# Patient Record
Sex: Female | Born: 1935 | Race: White | Hispanic: No | Smoking: Never smoker
Health system: Southern US, Community
[De-identification: ages and names within clinical notes are randomized; demographics above are authoritative.]

## PROBLEM LIST (undated history)

## (undated) DIAGNOSIS — M6281 Muscle weakness (generalized): Secondary | ICD-10-CM

## (undated) DIAGNOSIS — E079 Disorder of thyroid, unspecified: Secondary | ICD-10-CM

## (undated) DIAGNOSIS — S37009A Unspecified injury of unspecified kidney, initial encounter: Secondary | ICD-10-CM

## (undated) DIAGNOSIS — J849 Interstitial pulmonary disease, unspecified: Secondary | ICD-10-CM

## (undated) DIAGNOSIS — E039 Hypothyroidism, unspecified: Secondary | ICD-10-CM

## (undated) DIAGNOSIS — I10 Essential (primary) hypertension: Secondary | ICD-10-CM

## (undated) DIAGNOSIS — D649 Anemia, unspecified: Secondary | ICD-10-CM

## (undated) DIAGNOSIS — I509 Heart failure, unspecified: Secondary | ICD-10-CM

## (undated) DIAGNOSIS — F0390 Unspecified dementia without behavioral disturbance: Secondary | ICD-10-CM

## (undated) DIAGNOSIS — E785 Hyperlipidemia, unspecified: Secondary | ICD-10-CM

## (undated) HISTORY — PX: CATARACT EXTRACTION: SUR2

## (undated) HISTORY — PX: TONSILLECTOMY: SUR1361

## (undated) HISTORY — PX: TOTAL HIP ARTHROPLASTY: SHX124

---

## 2006-12-14 ENCOUNTER — Observation Stay (HOSPITAL_COMMUNITY): Admission: AD | Admit: 2006-12-14 | Discharge: 2006-12-15 | Payer: Self-pay | Admitting: Internal Medicine

## 2010-04-11 ENCOUNTER — Inpatient Hospital Stay (HOSPITAL_COMMUNITY)
Admission: EM | Admit: 2010-04-11 | Discharge: 2010-04-17 | DRG: 481 | Disposition: A | Discharge status: Skilled Nursing Facility | Payer: Medicare Other | Attending: Internal Medicine | Admitting: Internal Medicine

## 2010-04-11 ENCOUNTER — Emergency Department (HOSPITAL_COMMUNITY): Payer: Medicare Other

## 2010-04-11 DIAGNOSIS — I2789 Other specified pulmonary heart diseases: Secondary | ICD-10-CM | POA: Diagnosis present

## 2010-04-11 DIAGNOSIS — E785 Hyperlipidemia, unspecified: Secondary | ICD-10-CM | POA: Diagnosis present

## 2010-04-11 DIAGNOSIS — J40 Bronchitis, not specified as acute or chronic: Secondary | ICD-10-CM | POA: Diagnosis present

## 2010-04-11 DIAGNOSIS — J8410 Pulmonary fibrosis, unspecified: Secondary | ICD-10-CM | POA: Diagnosis present

## 2010-04-11 DIAGNOSIS — D638 Anemia in other chronic diseases classified elsewhere: Secondary | ICD-10-CM | POA: Diagnosis present

## 2010-04-11 DIAGNOSIS — E119 Type 2 diabetes mellitus without complications: Secondary | ICD-10-CM | POA: Diagnosis present

## 2010-04-11 DIAGNOSIS — W19XXXA Unspecified fall, initial encounter: Secondary | ICD-10-CM | POA: Diagnosis present

## 2010-04-11 DIAGNOSIS — J4489 Other specified chronic obstructive pulmonary disease: Secondary | ICD-10-CM | POA: Diagnosis present

## 2010-04-11 DIAGNOSIS — J811 Chronic pulmonary edema: Secondary | ICD-10-CM | POA: Diagnosis present

## 2010-04-11 DIAGNOSIS — Y92009 Unspecified place in unspecified non-institutional (private) residence as the place of occurrence of the external cause: Secondary | ICD-10-CM

## 2010-04-11 DIAGNOSIS — J449 Chronic obstructive pulmonary disease, unspecified: Secondary | ICD-10-CM | POA: Diagnosis present

## 2010-04-11 DIAGNOSIS — I5030 Unspecified diastolic (congestive) heart failure: Secondary | ICD-10-CM | POA: Diagnosis present

## 2010-04-11 DIAGNOSIS — E039 Hypothyroidism, unspecified: Secondary | ICD-10-CM | POA: Diagnosis present

## 2010-04-11 DIAGNOSIS — S72033A Displaced midcervical fracture of unspecified femur, initial encounter for closed fracture: Principal | ICD-10-CM | POA: Diagnosis present

## 2010-04-11 DIAGNOSIS — N289 Disorder of kidney and ureter, unspecified: Secondary | ICD-10-CM | POA: Diagnosis present

## 2010-04-11 LAB — CBC
HCT: 34.6 % — ABNORMAL LOW (ref 36.0–46.0)
Hemoglobin: 10.8 g/dL — ABNORMAL LOW (ref 12.0–15.0)
MCH: 31.4 pg (ref 26.0–34.0)
Platelets: 192 10*3/uL (ref 150–400)
RBC: 3.44 MIL/uL — ABNORMAL LOW (ref 3.87–5.11)
RDW: 13.2 % (ref 11.5–15.5)
WBC: 10.9 10*3/uL — ABNORMAL HIGH (ref 4.0–10.5)

## 2010-04-11 LAB — BASIC METABOLIC PANEL
CO2: 26 mEq/L (ref 19–32)
Calcium: 8.9 mg/dL (ref 8.4–10.5)
Chloride: 105 mEq/L (ref 96–112)
Creatinine, Ser: 1.42 mg/dL — ABNORMAL HIGH (ref 0.4–1.2)
Potassium: 4.9 mEq/L (ref 3.5–5.1)
Sodium: 138 mEq/L (ref 135–145)

## 2010-04-11 LAB — GLUCOSE, CAPILLARY
Glucose-Capillary: 152 mg/dL — ABNORMAL HIGH (ref 70–99)
Glucose-Capillary: 162 mg/dL — ABNORMAL HIGH (ref 70–99)

## 2010-04-11 LAB — DIFFERENTIAL
Basophils Absolute: 0 10*3/uL (ref 0.0–0.1)
Eosinophils Absolute: 0.2 10*3/uL (ref 0.0–0.7)
Neutro Abs: 8.2 10*3/uL — ABNORMAL HIGH (ref 1.7–7.7)

## 2010-04-11 LAB — URINALYSIS, ROUTINE W REFLEX MICROSCOPIC
Bilirubin Urine: NEGATIVE
Glucose, UA: NEGATIVE mg/dL
Nitrite: NEGATIVE
Urobilinogen, UA: 0.2 mg/dL (ref 0.0–1.0)
pH: 5.5 (ref 5.0–8.0)

## 2010-04-12 ENCOUNTER — Inpatient Hospital Stay (HOSPITAL_COMMUNITY): Payer: Medicare Other

## 2010-04-12 LAB — GLUCOSE, CAPILLARY: Glucose-Capillary: 145 mg/dL — ABNORMAL HIGH (ref 70–99)

## 2010-04-12 LAB — BASIC METABOLIC PANEL
CO2: 25 mEq/L (ref 19–32)
Calcium: 8.5 mg/dL (ref 8.4–10.5)
GFR calc Af Amer: 52 mL/min — ABNORMAL LOW (ref >60)
GFR calc non Af Amer: 43 mL/min — ABNORMAL LOW (ref >60)
Sodium: 139 mEq/L (ref 135–145)

## 2010-04-12 LAB — URINE CULTURE: Colony Count: NO GROWTH

## 2010-04-12 LAB — PROTIME-INR
INR: 1.03 (ref 0.00–1.49)
Prothrombin Time: 13.7 seconds (ref 11.6–15.2)

## 2010-04-12 LAB — CBC
Hemoglobin: 10.3 g/dL — ABNORMAL LOW (ref 12.0–15.0)
MCV: 100.6 fL — ABNORMAL HIGH (ref 78.0–100.0)
Platelets: 168 10*3/uL (ref 150–400)
RDW: 13.2 % (ref 11.5–15.5)
WBC: 11.2 10*3/uL — ABNORMAL HIGH (ref 4.0–10.5)

## 2010-04-13 LAB — BASIC METABOLIC PANEL
CO2: 23 mEq/L (ref 19–32)
Chloride: 107 mEq/L (ref 96–112)
Creatinine, Ser: 1.37 mg/dL — ABNORMAL HIGH (ref 0.4–1.2)
Glucose, Bld: 112 mg/dL — ABNORMAL HIGH (ref 70–99)
Potassium: 4.5 mEq/L (ref 3.5–5.1)
Sodium: 138 mEq/L (ref 135–145)

## 2010-04-13 LAB — CBC
HCT: 27.5 % — ABNORMAL LOW (ref 36.0–46.0)
MCH: 31.5 pg (ref 26.0–34.0)
Platelets: 150 10*3/uL (ref 150–400)
RBC: 2.76 MIL/uL — ABNORMAL LOW (ref 3.87–5.11)
RDW: 13.1 % (ref 11.5–15.5)
WBC: 10.3 10*3/uL (ref 4.0–10.5)

## 2010-04-13 LAB — PROTIME-INR: INR: 1.08 (ref 0.00–1.49)

## 2010-04-13 LAB — GLUCOSE, CAPILLARY: Glucose-Capillary: 95 mg/dL (ref 70–99)

## 2010-04-14 ENCOUNTER — Inpatient Hospital Stay (HOSPITAL_COMMUNITY): Payer: Medicare Other

## 2010-04-14 LAB — CBC
HCT: 28 % — ABNORMAL LOW (ref 36.0–46.0)
Hemoglobin: 9 g/dL — ABNORMAL LOW (ref 12.0–15.0)
MCV: 100.4 fL — ABNORMAL HIGH (ref 78.0–100.0)
RDW: 13.1 % (ref 11.5–15.5)
WBC: 8.9 10*3/uL (ref 4.0–10.5)

## 2010-04-14 LAB — GLUCOSE, CAPILLARY
Glucose-Capillary: 128 mg/dL — ABNORMAL HIGH (ref 70–99)
Glucose-Capillary: 148 mg/dL — ABNORMAL HIGH (ref 70–99)
Glucose-Capillary: 169 mg/dL — ABNORMAL HIGH (ref 70–99)
Glucose-Capillary: 120 mg/dL — ABNORMAL HIGH (ref 70–99)

## 2010-04-14 LAB — DIFFERENTIAL
Eosinophils Relative: 0 % (ref 0–5)
Lymphocytes Relative: 11 % — ABNORMAL LOW (ref 12–46)
Lymphs Abs: 1 10*3/uL (ref 0.7–4.0)
Monocytes Absolute: 1 10*3/uL (ref 0.1–1.0)
Neutro Abs: 6.9 10*3/uL (ref 1.7–7.7)

## 2010-04-14 LAB — PROTIME-INR: INR: 1.14 (ref 0.00–1.49)

## 2010-04-14 LAB — BASIC METABOLIC PANEL
CO2: 22 mEq/L (ref 19–32)
Chloride: 108 mEq/L (ref 96–112)
Creatinine, Ser: 1.38 mg/dL — ABNORMAL HIGH (ref 0.4–1.2)
GFR calc Af Amer: 43 mL/min — ABNORMAL LOW (ref >60)
Potassium: 4.1 mEq/L (ref 3.5–5.1)

## 2010-04-15 LAB — CBC
HCT: 28.6 % — ABNORMAL LOW (ref 36.0–46.0)
MCHC: 30.8 g/dL (ref 30.0–36.0)
MCV: 100.4 fL — ABNORMAL HIGH (ref 78.0–100.0)
RDW: 13.5 % (ref 11.5–15.5)
WBC: 8.7 10*3/uL (ref 4.0–10.5)

## 2010-04-15 LAB — URINE MICROSCOPIC-ADD ON

## 2010-04-15 LAB — GLUCOSE, CAPILLARY: Glucose-Capillary: 170 mg/dL — ABNORMAL HIGH (ref 70–99)

## 2010-04-15 LAB — URINALYSIS, ROUTINE W REFLEX MICROSCOPIC
Bilirubin Urine: NEGATIVE
Ketones, ur: NEGATIVE mg/dL
Nitrite: NEGATIVE
Protein, ur: 30 mg/dL — AB
Urobilinogen, UA: 0.2 mg/dL (ref 0.0–1.0)

## 2010-04-16 LAB — CBC
HCT: 29.2 % — ABNORMAL LOW (ref 36.0–46.0)
MCH: 31.6 pg (ref 26.0–34.0)
MCHC: 31.5 g/dL (ref 30.0–36.0)
RDW: 13.5 % (ref 11.5–15.5)

## 2010-04-16 LAB — GLUCOSE, CAPILLARY
Glucose-Capillary: 234 mg/dL — ABNORMAL HIGH (ref 70–99)
Glucose-Capillary: 139 mg/dL — ABNORMAL HIGH (ref 70–99)

## 2010-04-16 LAB — BASIC METABOLIC PANEL
BUN: 32 mg/dL — ABNORMAL HIGH (ref 6–23)
CO2: 24 mEq/L (ref 19–32)
Chloride: 108 mEq/L (ref 96–112)
Creatinine, Ser: 1.19 mg/dL (ref 0.4–1.2)
Glucose, Bld: 114 mg/dL — ABNORMAL HIGH (ref 70–99)

## 2010-04-16 LAB — URINE CULTURE
Culture  Setup Time: 201203140428
Special Requests: NEGATIVE

## 2010-04-16 LAB — BRAIN NATRIURETIC PEPTIDE: Pro B Natriuretic peptide (BNP): 222 pg/mL — ABNORMAL HIGH (ref 0.0–100.0)

## 2010-04-16 LAB — PROTIME-INR: INR: 1.93 — ABNORMAL HIGH (ref 0.00–1.49)

## 2010-04-17 LAB — BASIC METABOLIC PANEL
CO2: 26 mEq/L (ref 19–32)
Calcium: 8 mg/dL — ABNORMAL LOW (ref 8.4–10.5)
Creatinine, Ser: 1.06 mg/dL (ref 0.4–1.2)
GFR calc Af Amer: 58 mL/min — ABNORMAL LOW (ref >60)
GFR calc non Af Amer: 48 mL/min — ABNORMAL LOW (ref >60)
Sodium: 138 mEq/L (ref 135–145)

## 2010-04-17 LAB — PROTIME-INR
INR: 2.81 — ABNORMAL HIGH (ref 0.00–1.49)
Prothrombin Time: 29.7 seconds — ABNORMAL HIGH (ref 11.6–15.2)

## 2010-04-17 LAB — CBC
Hemoglobin: 8.7 g/dL — ABNORMAL LOW (ref 12.0–15.0)
MCHC: 31.3 g/dL (ref 30.0–36.0)
Platelets: 212 10*3/uL (ref 150–400)
RDW: 13.3 % (ref 11.5–15.5)

## 2010-04-17 LAB — GLUCOSE, CAPILLARY: Glucose-Capillary: 119 mg/dL — ABNORMAL HIGH (ref 70–99)

## 2010-06-16 NOTE — H&P (Signed)
NAMEWINDY, DUDEK NO.:  [id number]   MEDICAL RECORD NO.:  [id number]          PATIENT TYPE:  INP   LOCATION:  5731                         FACILITY:  [facility]   PHYSICIAN:  Lonia Blood, M.D.       DATE OF BIRTH:  13-Jan-1914   DATE OF ADMISSION:  12/14/2006  DATE OF DISCHARGE:                              HISTORY & PHYSICAL   PRIMARY CARE PHYSICIAN:  Gloriajean Dell. Andrey Campanile, M.D.   CHIEF COMPLAINT:  Weak, polyuria.   HISTORY OF PRESENT ILLNESS:  Ms. Bui is a 77 year old woman who was  taken to her primary care physician's office after she has been  complaining of increasing thirst and urination for the past couple  months.  The patient was also complaining of some dysuria, foul-smelling  urine, and low-grade temperatures.  When seen in the physician's office  the patient was found to have leukocytosis up to 18,000 and her CBG was  too high to measure.  The patient denies any fever, chills, nausea,  vomiting, or abdominal pain.   PAST MEDICAL HISTORY:  1. Hypertension.  2. Seasonal allergy.  3. Cataract surgeries.  4. Tonsillectomy.   HOME MEDICATIONS:  Hydrochlorothiazide, atenolol, and multivitamins.   ALLERGIES:  PENICILLIN.   SOCIAL HISTORY:  The patient lives alone.  She has a daughter who checks  on her twice a day.  The patient has never smoked cigarettes, and she  does not drink any alcohol.  She is retired, and she has also been a  widow for the past 15 years.  She is not driving anymore.   FAMILY HISTORY:  The patient has a sister who was diagnosed with  diabetes in her 19s and with Alzheimer as well in her 51s.   REVIEW OF SYSTEMS:  As per HPI.  All other systems have been reviewed,  and they are negative with the exception of some difficulties hearing,  and difficulties with vision.  The patient is wearing glasses.   PHYSICAL EXAM:  GENERAL:  Upon admission shows a well-developed, well-  nourished elderly woman in no acute distress.  She is  alert and oriented  to place, person, and time.  HEAD:  Appears normocephalic, atraumatic.  EYES:  Pupils equal, round, react to light and accommodation.  There is  bilateral iridodonesis  Extraocular movements are intact.  MOUTH:  The patient's mouth is showing wide spread caries, and missing  multiple teeth.  THROAT:  Clear.  NECK:  Supple.  No JVD.  No carotid bruits.  CHEST:  Scattered rhonchi and crackles and no wheezes.  HEART:  Regular without murmurs, rubs, or gallop.  ABDOMEN:  The patient's abdomen is soft, nontender, nondistended, bowel  sounds are present.  GENITOURINARY:  There is minor left CVA tenderness.  EXTREMITIES:  Lower Extremities have +1 bilateral edema.  SKIN:  Dry.  Turgor is positive.  There are no suspicious rashes.  NEUROLOGICAL EXAM:  Cranial nerves III-XII are intact.  Sensation is  intact in all 4 extremities.  Strength 5/5 in all four extremities.  FUNCTIONAL STATUS:  The patient is ambulating with a  walker for safety.  MUSCULOSKELETAL:  The patient has some moderate thoracic kyphosis and  deformities of the distal interphalangeal joints on both hands.   LABORATORY VALUES:  Everything is pending.   ASSESSMENT AND PLAN:  1. Newly diagnosed uncontrolled diabetes mellitus type 2 leading to      dehydration in an elderly community-dwelling woman.  Plan is to      admit the patient to the hospital, place her on intravenous fluids.      Check a hemoglobin A1c.  Educate the patient about the proper diet.      Initiate treatment with insulin for now to bring a rapid decline in      the blood glucose levels.  The patient will also be started on      Amaryl and maybe metformin depending on her renal function.  I do      suspect that with proper diet and medications Ms. Berte can      achieve an acceptable control of her diabetes.  2. Urinary tract infection.  The patient's urine will be cultured, and      she will be placed empirically on ciprofloxacin since  she is      PENICILLIN allergic.  3. Weak, deconditioned.  Ms. Wiegman will have a physical therapy      evaluation to assess safety of returning home.  4. Code status has been discussed with the patient and her daughter,      and the patient has elected a do not resuscitate status.      Lonia Blood, M.D.  Electronically Signed     SL/MEDQ  D:  12/14/2006  T:  12/15/2006  Job:  [id number]   cc:   Gloriajean Dell. Andrey Campanile, M.D.

## 2010-06-16 NOTE — Discharge Summary (Signed)
Christina Douglas, Christina Douglas NO.:  [id number]   MEDICAL RECORD NO.:  [id number]          PATIENT TYPE:  OBV   LOCATION:  5731                         FACILITY:  [facility]   PHYSICIAN:  Lonia Blood, M.D.       DATE OF BIRTH:  [birth year]   DATE OF ADMISSION:  12/14/2006  DATE OF DISCHARGE:  12/15/2006                               DISCHARGE SUMMARY   DISCHARGE DIAGNOSES:  1. New onset uncontrolled diabetes mellitus Type 2 with measured      hemoglobin A-1-C of 9.8.  2. Dehydration secondary to the above.  3. Leukocytosis secondary to the above.  4. Urinary tract infection.  5. New diagnosis of hypothyroidism with a measured TSH of 148.  6. Hyperlipidemia.  7. Anemia of chronic disease; defer workup for the outpatient setting.  8. Chronic interstitial lung disease of unknown significance.  9. Weakness and deconditioning.  10.Bradycardia secondary to atenolol and hypothyroidism.  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   DISCHARGE MEDICATIONS:  1. Levothyroxine 50 mcg by mouth daily.  2. Amlodipine 5 mg by mouth daily.  3. Glimepiride  2 mg daily.  4. Vitamins daily.  5. Ciprofloxacin 500 mg by mouth daily for 3 days.  6. Lantus 5 units at bedtime if the morning CBG is more than 300.   CONDITION ON DISCHARGE:  The patient was discharged home with Home  Health Services.  She was discharged under the care of her daughter.  The patient will follow up with Dr. Benedetto Goad on December 21, 2006.  The patient was also referred to Dr. Donaciano Eva office for an audiogram and  hearing aid.  At the time of discharge the patient was thoroughly  educated about her new diagnosis of diabetes.  The patient was referred  to outpatient classes.  The patient was given a prescription for  Glucometer as well as lancets and syringes.   PROCEDURES DURING THIS ADMISSION:  No procedures were done.   CONSULTATIONS:  No consultations were obtained.   HISTORY AND PHYSICAL EXAMINATION:  For the history and physical  examination please refer to the dictated H&P done by Dr. Lavera Guise December 14, 2006.   HOSPITAL COURSE:  Problem 1  Newly diagnosed uncontrolled diabetes  mellitus Type 2.  This patient presented from her primary care  physician's office with polyuria, polydipsia, dehydration, leukocytosis,  and extreme elevation of the CBGs.  The patient was educated about her  new diagnosis.  She was placed on intravenous fluids.  She received  repeat doses of NovoLog and Lantus.  The patient was setup with diabetes  education and she was discharged home on Amaryl and Lantus.  The patient  may not require insulin in the long run, but this will have to be  determined at her 1-week follow up visit.  Problem 2  Urinary tract infection.  This was probably contributing some  to her leukocytosis, but overall, I would say that this is more like  cystitis rather than pyelonephritis.  A urine culture was sent and  empiric ciprofloxacin was begun here in the hospital.  The patient will  continue and complete  3 days of the antibiotic at home..  Problem  3  Newly diagnosed hypothyroidism.  The patient's measured TSH  was 148.  This prompted the initiation of treatment with Synthroid in  the form of 50 mcg by mouth daily.  We have also discontinued the  atenolol as the patient was bradycardic into the 50s.  Problem  4  Hyperlipidemia with a measured LDL level of 150.  I think  this is secondary to the patient's hypothyroidism.  After correction of  the hypothyroidism another fasting lipid panel needs to be obtained to  determine if the patient requires a statin.      Lonia Blood, M.D.  Electronically Signed     SL/MEDQ  D:  12/15/2006  T:  12/16/2006  Job:  [id number]   cc:   Gloriajean Dell. Andrey Campanile, M.D.

## 2010-11-10 LAB — CK TOTAL AND CKMB (NOT AT ARMC)
Relative Index: INVALID
Total CK: 68

## 2010-11-10 LAB — TROPONIN I: Troponin I: 0.03

## 2010-11-10 LAB — DIFFERENTIAL
Basophils Relative: 0
Eosinophils Absolute: 0.1 — ABNORMAL LOW
Lymphs Abs: 2.5
Monocytes Relative: 4
Neutro Abs: 15.6 — ABNORMAL HIGH
Neutrophils Relative %: 81 — ABNORMAL HIGH

## 2010-11-10 LAB — COMPREHENSIVE METABOLIC PANEL
ALT: 10
BUN: 30 — ABNORMAL HIGH
CO2: 29
Calcium: 9.6
GFR calc non Af Amer: 47 — ABNORMAL LOW
Glucose, Bld: 305 — ABNORMAL HIGH
Total Protein: 7.4

## 2010-11-10 LAB — URINE CULTURE
Colony Count: >=100000
Special Requests: NEGATIVE

## 2010-11-10 LAB — BASIC METABOLIC PANEL
BUN: 25 — ABNORMAL HIGH
Chloride: 99
GFR calc Af Amer: >60
GFR calc non Af Amer: 54 — ABNORMAL LOW
Potassium: 3.7
Sodium: 136

## 2010-11-10 LAB — CBC
HCT: 31.4 — ABNORMAL LOW
Hemoglobin: 10.6 — ABNORMAL LOW
MCHC: 33.3
MCV: 96.8
Platelets: 222
Platelets: 223
RBC: 3.28 — ABNORMAL LOW
RDW: 13.1
WBC: 11 — ABNORMAL HIGH

## 2010-11-10 LAB — URINALYSIS, MICROSCOPIC ONLY
Glucose, UA: 250 — AB
Ketones, ur: NEGATIVE
Protein, ur: 100 — AB
Urobilinogen, UA: 1

## 2010-11-10 LAB — LIPID PANEL
Total CHOL/HDL Ratio: 5.6
VLDL: 24

## 2010-11-10 LAB — HEMOGLOBIN A1C
Hgb A1c MFr Bld: 9.8 — ABNORMAL HIGH
Mean Plasma Glucose: 272

## 2011-05-14 ENCOUNTER — Observation Stay (HOSPITAL_COMMUNITY)
Admission: EM | Admit: 2011-05-14 | Discharge: 2011-05-15 | Disposition: A | Discharge status: Skilled Nursing Facility | Payer: Medicare Other | Attending: Internal Medicine | Admitting: Internal Medicine

## 2011-05-14 ENCOUNTER — Emergency Department (HOSPITAL_COMMUNITY): Payer: Medicare Other

## 2011-05-14 ENCOUNTER — Encounter (HOSPITAL_COMMUNITY): Payer: Self-pay | Admitting: Emergency Medicine

## 2011-05-14 DIAGNOSIS — E785 Hyperlipidemia, unspecified: Secondary | ICD-10-CM | POA: Insufficient documentation

## 2011-05-14 DIAGNOSIS — E860 Dehydration: Secondary | ICD-10-CM

## 2011-05-14 DIAGNOSIS — N289 Disorder of kidney and ureter, unspecified: Secondary | ICD-10-CM

## 2011-05-14 DIAGNOSIS — R5381 Other malaise: Secondary | ICD-10-CM

## 2011-05-14 DIAGNOSIS — R05 Cough: Secondary | ICD-10-CM | POA: Diagnosis present

## 2011-05-14 DIAGNOSIS — E1165 Type 2 diabetes mellitus with hyperglycemia: Secondary | ICD-10-CM | POA: Diagnosis present

## 2011-05-14 DIAGNOSIS — I10 Essential (primary) hypertension: Secondary | ICD-10-CM | POA: Diagnosis present

## 2011-05-14 DIAGNOSIS — H9190 Unspecified hearing loss, unspecified ear: Secondary | ICD-10-CM | POA: Diagnosis present

## 2011-05-14 DIAGNOSIS — IMO0001 Reserved for inherently not codable concepts without codable children: Secondary | ICD-10-CM

## 2011-05-14 DIAGNOSIS — D649 Anemia, unspecified: Secondary | ICD-10-CM

## 2011-05-14 DIAGNOSIS — N390 Urinary tract infection, site not specified: Principal | ICD-10-CM

## 2011-05-14 DIAGNOSIS — R059 Cough, unspecified: Secondary | ICD-10-CM

## 2011-05-14 DIAGNOSIS — Z794 Long term (current) use of insulin: Secondary | ICD-10-CM | POA: Insufficient documentation

## 2011-05-14 DIAGNOSIS — E039 Hypothyroidism, unspecified: Secondary | ICD-10-CM

## 2011-05-14 DIAGNOSIS — Z79899 Other long term (current) drug therapy: Secondary | ICD-10-CM | POA: Insufficient documentation

## 2011-05-14 DIAGNOSIS — I5032 Chronic diastolic (congestive) heart failure: Secondary | ICD-10-CM

## 2011-05-14 DIAGNOSIS — E119 Type 2 diabetes mellitus without complications: Secondary | ICD-10-CM | POA: Insufficient documentation

## 2011-05-14 DIAGNOSIS — IMO0002 Reserved for concepts with insufficient information to code with codable children: Secondary | ICD-10-CM

## 2011-05-14 HISTORY — DX: Disorder of thyroid, unspecified: E07.9

## 2011-05-14 HISTORY — DX: Anemia, unspecified: D64.9

## 2011-05-14 HISTORY — DX: Unspecified injury of unspecified kidney, initial encounter: S37.009A

## 2011-05-14 HISTORY — DX: Heart failure, unspecified: I50.9

## 2011-05-14 HISTORY — DX: Unspecified dementia, unspecified severity, without behavioral disturbance, psychotic disturbance, mood disturbance, and anxiety: F03.90

## 2011-05-14 HISTORY — DX: Muscle weakness (generalized): M62.81

## 2011-05-14 HISTORY — DX: Hyperlipidemia, unspecified: E78.5

## 2011-05-14 HISTORY — DX: Interstitial pulmonary disease, unspecified: J84.9

## 2011-05-14 HISTORY — DX: Essential (primary) hypertension: I10

## 2011-05-14 HISTORY — DX: Hypothyroidism, unspecified: E03.9

## 2011-05-14 LAB — COMPREHENSIVE METABOLIC PANEL
Albumin: 2.7 g/dL — ABNORMAL LOW (ref 3.5–5.2)
BUN: 59 mg/dL — ABNORMAL HIGH (ref 6–23)
CO2: 25 mEq/L (ref 19–32)
Chloride: 100 mEq/L (ref 96–112)
Creatinine, Ser: 1.48 mg/dL — ABNORMAL HIGH (ref 0.50–1.10)
GFR calc non Af Amer: 29 mL/min — ABNORMAL LOW (ref >90)
Total Bilirubin: 0.4 mg/dL (ref 0.3–1.2)

## 2011-05-14 LAB — CBC
HCT: 34 % — ABNORMAL LOW (ref 36.0–46.0)
Hemoglobin: 11 g/dL — ABNORMAL LOW (ref 12.0–15.0)
MCHC: 32.4 g/dL (ref 30.0–36.0)
MCV: 97.4 fL (ref 78.0–100.0)

## 2011-05-14 LAB — URINALYSIS, ROUTINE W REFLEX MICROSCOPIC
Ketones, ur: 15 mg/dL — AB
Nitrite: NEGATIVE
Protein, ur: NEGATIVE mg/dL
Urobilinogen, UA: 0.2 mg/dL (ref 0.0–1.0)
pH: 5 (ref 5.0–8.0)

## 2011-05-14 LAB — TYPE AND SCREEN: Antibody Screen: NEGATIVE

## 2011-05-14 LAB — URINE MICROSCOPIC-ADD ON

## 2011-05-14 LAB — DIFFERENTIAL
Basophils Relative: 0 % (ref 0–1)
Eosinophils Relative: 1 % (ref 0–5)
Monocytes Absolute: 1 10*3/uL (ref 0.1–1.0)
Monocytes Relative: 7 % (ref 3–12)
Neutro Abs: 11.7 10*3/uL — ABNORMAL HIGH (ref 1.7–7.7)

## 2011-05-14 LAB — TROPONIN I: Troponin I: <0.3 ng/mL (ref <0.30)

## 2011-05-14 LAB — LACTIC ACID, PLASMA: Lactic Acid, Venous: 0.9 mmol/L (ref 0.5–2.2)

## 2011-05-14 LAB — GLUCOSE, CAPILLARY

## 2011-05-14 MED ORDER — ACETAMINOPHEN 325 MG PO TABS: 650.0000 mg | ORAL_TABLET | ORAL | Status: DC | PRN

## 2011-05-14 MED ORDER — DEXTROSE 5 % IV SOLN
INTRAVENOUS | Status: AC
  Filled 2011-05-14: qty 10

## 2011-05-14 MED ORDER — POLYETHYLENE GLYCOL 3350 17 G PO PACK: 17.0000 g | PACK | Freq: Every day | ORAL | Status: DC | PRN

## 2011-05-14 MED ORDER — SODIUM CHLORIDE 0.9 % IV SOLN
INTRAVENOUS | Status: DC
  Administered 2011-05-14: 15:00:00 via INTRAVENOUS

## 2011-05-14 MED ORDER — ONDANSETRON HCL 4 MG/2ML IJ SOLN: 4.0000 mg | Freq: Four times a day (QID) | INTRAMUSCULAR | Status: DC | PRN

## 2011-05-14 MED ORDER — ONDANSETRON HCL 4 MG PO TABS: 4.0000 mg | ORAL_TABLET | Freq: Four times a day (QID) | ORAL | Status: DC | PRN

## 2011-05-14 MED ORDER — HYDROCODONE-ACETAMINOPHEN 5-325 MG PO TABS: 1.0000 | ORAL_TABLET | Freq: Two times a day (BID) | ORAL | Status: DC | PRN

## 2011-05-14 MED ORDER — INSULIN ASPART 100 UNIT/ML ~~LOC~~ SOLN: 2.0000 [IU] | Freq: Three times a day (TID) | SUBCUTANEOUS | Status: DC

## 2011-05-14 MED ORDER — LEVOTHYROXINE SODIUM 75 MCG PO TABS
75.0000 ug | ORAL_TABLET | Freq: Every day | ORAL | Status: DC
  Administered 2011-05-15: 75 ug via ORAL
  Filled 2011-05-14: qty 1

## 2011-05-14 MED ORDER — ALBUTEROL SULFATE (5 MG/ML) 0.5% IN NEBU
2.5000 mg | INHALATION_SOLUTION | Freq: Four times a day (QID) | RESPIRATORY_TRACT | Status: DC
  Administered 2011-05-14 – 2011-05-15 (×3): 2.5 mg via RESPIRATORY_TRACT
  Filled 2011-05-14 (×3): qty 0.5

## 2011-05-14 MED ORDER — SODIUM CHLORIDE 0.9 % IV SOLN
INTRAVENOUS | Status: DC
  Administered 2011-05-14: 19:00:00 via INTRAVENOUS

## 2011-05-14 MED ORDER — FLUTICASONE-SALMETEROL 100-50 MCG/DOSE IN AEPB
1.0000 | INHALATION_SPRAY | Freq: Two times a day (BID) | RESPIRATORY_TRACT | Status: DC
  Administered 2011-05-15: 1 via RESPIRATORY_TRACT
  Filled 2011-05-14: qty 14

## 2011-05-14 MED ORDER — CIPROFLOXACIN IN D5W 400 MG/200ML IV SOLN
400.0000 mg | Freq: Once | INTRAVENOUS | Status: AC
  Administered 2011-05-14: 400 mg via INTRAVENOUS
  Filled 2011-05-14: qty 200

## 2011-05-14 MED ORDER — SODIUM CHLORIDE 0.9 % IV SOLN: INTRAVENOUS | Status: DC

## 2011-05-14 MED ORDER — SODIUM CHLORIDE 0.9 % IV BOLUS (SEPSIS): 250.0000 mL | Freq: Once | INTRAVENOUS | Status: DC

## 2011-05-14 MED ORDER — INSULIN ASPART 100 UNIT/ML ~~LOC~~ SOLN
0.0000 [IU] | Freq: Three times a day (TID) | SUBCUTANEOUS | Status: DC
  Administered 2011-05-15: 1 [IU] via SUBCUTANEOUS

## 2011-05-14 MED ORDER — ENOXAPARIN SODIUM 30 MG/0.3ML ~~LOC~~ SOLN
30.0000 mg | SUBCUTANEOUS | Status: DC
  Administered 2011-05-14: 30 mg via SUBCUTANEOUS
  Filled 2011-05-14: qty 0.3

## 2011-05-14 MED ORDER — DEXTROSE 5 % IV SOLN
1.0000 g | INTRAVENOUS | Status: DC
  Administered 2011-05-14: 1 g via INTRAVENOUS
  Filled 2011-05-14 (×3): qty 10

## 2011-05-14 MED ORDER — DM-GUAIFENESIN ER 30-600 MG PO TB12
1.0000 | ORAL_TABLET | Freq: Two times a day (BID) | ORAL | Status: DC
  Administered 2011-05-14 – 2011-05-15 (×2): 1 via ORAL
  Filled 2011-05-14 (×2): qty 1

## 2011-05-14 NOTE — H&P (Signed)
Hospital Admission Note Date: 05/14/2011  Patient name: Christina Douglas Medical record number: [id number] Date of birth: 10/30/1913 Age: 78 y.o. Gender: female PCP: Terald Sleeper, MD, MD  Attending physician: Christiane Ha, MD  Chief Complaint: cough  History of Present Illness:  Christina Douglas is an 78 y.o. female who was sent to the emergency room from skilled nursing facility with cough. Patient is hard of hearing. She is unable to give much history. She reports a chronic cough. It appears that she was started on levofloxacin 12 days ago. She reports her appetite has been poor. His cold. She has no pain. No shortness of breath. No nausea vomiting or diarrhea. In the emergency room, her chest x-ray shows no infiltrate. She does have a leukocytosis and evidence of urinary tract infection. Her BUN and creatinine are slightly higher than usual as well. According to demographic data from nursing home, she is full code. No family members or friends are available currently to provide any further history. MAR from nursing home is available but no recent nursing home notes.  Past Medical History  Diagnosis Date  . Diabetes mellitus   . Muscle weakness (generalized)   . Diastolic heart failure   . Hypertension   . Thyroid disease   . Kidney injury   . Dementia   . CHF (congestive heart failure)   . Anemia   . Hypothyroid   . Interstitial lung disorders   . Hyperlipidemia     Meds: Prescriptions prior to admission  Medication Sig Dispense Refill  . acetaminophen (TYLENOL) 325 MG tablet Take 650 mg by mouth every 4 (four) hours as needed. Pain/fever      . acidophilus (RISAQUAD) CAPS Take 1 capsule by mouth 2 (two) times daily. 14 day course, should be completed by 05/17/11      . Cholecalciferol 2000 UNITS TABS Take 1 tablet by mouth daily.      . Fluticasone-Salmeterol (ADVAIR) 100-50 MCG/DOSE AEPB Inhale 1 puff into the lungs every 12 (twelve) hours.      Marland Kitchen  guaiFENesin-dextromethorphan (ROBITUSSIN DM) 100-10 MG/5ML syrup Take 10 mLs by mouth every 4 (four) hours as needed. cough      . HYDROcodone-acetaminophen (NORCO) 5-325 MG per tablet Take 1 tablet by mouth 2 (two) times daily as needed. Moderate to severe pain      . insulin aspart (NOVOLOG) 100 UNIT/ML injection Inject 2-8 Units into the skin 3 (three) times daily before meals. Sliding scale//Accu-Checks before meals and at bedtime      . insulin glargine (LANTUS) 100 UNIT/ML injection Inject 8 Units into the skin at bedtime.      Marland Kitchen ipratropium-albuterol (DUONEB) 0.5-2.5 (3) MG/3ML SOLN Take 3 mLs by nebulization every 6 (six) hours as needed. Shortness of breath      . levothyroxine (SYNTHROID, LEVOTHROID) 75 MCG tablet Take 75 mcg by mouth daily.      Marland Kitchen lisinopril (PRINIVIL,ZESTRIL) 10 MG tablet Take 10 mg by mouth daily. Hold for systolic BP <105      . Multiple Vitamin (MULITIVITAMIN WITH MINERALS) TABS Take 1 tablet by mouth daily.      . Multiple Vitamin (MULITIVITAMIN WITH MINERALS) TABS Take 1 tablet by mouth daily. Centrum Silver      . omeprazole (PRILOSEC) 20 MG capsule Take 20 mg by mouth daily.      . polyethylene glycol (MIRALAX / GLYCOLAX) packet Take 17 g by mouth daily.      Marland Kitchen levofloxacin (LEVAQUIN) 250 MG tablet  Take 250 mg by mouth daily. Take for 9 days        Allergies: Penicillins  Social history: Does not currently smoke or drink. She is widowed.  Family history: Unknown. Patient is unable to answer this question regarding pertinent family history.  History reviewed. No pertinent family history. Past Surgical History  Procedure Date  . Cataract extraction   . Tonsillectomy   . Total hip arthroplasty     Review of Systems: Systems reviewed and as per HPI, otherwise negative.  Physical Exam: Blood pressure 109/65, pulse 96, temperature 98.2 F (36.8 C), temperature source Axillary, resp. rate 18, height 5\' 4"  (1.626 m), weight 47.7 kg (105 lb 2.6 oz), SpO2  95.00%. BP 109/65  Pulse 96  Temp(Src) 98.2 F (36.8 C) (Axillary)  Resp 18  Ht 5\' 4"  (1.626 m)  Wt 47.7 kg (105 lb 2.6 oz)  BMI 18.05 kg/m2  SpO2 95%  General Appearance:    Alert, cooperative, no distress, appears stated age.hard of hearing. Able to answer a few questions. Can follow a few commands. Communication is difficult periodic wet cough.   Head:    Normocephalic, without obvious abnormality, atraumatic  Eyes:    PERRL, conjunctiva/corneas clear, EOM's intact, fundi    benign, both eyes  Ears:    Normal TM's and external ear canals, both ears  Nose:   Nares normal, septum midline, mucosa normal, no drainage    or sinus tenderness  Throat:   Lips, mucosa, and tongue normal; teeth and gums normal  Neck:   Supple, symmetrical, trachea midline, no adenopathy;    thyroid:  no enlargement/tenderness/nodules; no carotid   bruit or JVD  Back:     Symmetric, no curvature, ROM normal, no CVA tenderness  Lungs:     Clear to auscultation bilaterally, respirations unlabored  Chest Wall:    No tenderness or deformity   Heart:    Regular rate and rhythm, S1 and S2 normal, no murmur, rub   or gallop  Breast Exam:    deferred  Abdomen:     Soft, non-tender, bowel sounds active all four quadrants,    no masses, no organomegaly  Genitalia:   deferred   Rectal:   deferred   Extremities:   Extremities normal, atraumatic, no cyanosis or edema  Pulses:   2+ and symmetric all extremities  Skin:   Skin color, texture, turgor normal, no rashes or lesions  Lymph nodes:   Cervical, supraclavicular, and axillary nodes normal  Neurologic:   CNII-XII intact, normal strength, sensation and reflexes    throughout    Psychiatric: Normal affect. Cooperative.  Lab results: Basic Metabolic Panel:  Basename 05/14/11 1240  NA 136  K 5.0  CL 100  CO2 25  GLUCOSE 113*  BUN 59*  CREATININE 1.48*  CALCIUM 10.6*  MG --  PHOS --   Liver Function Tests:  Basename 05/14/11 1240  AST 23  ALT 7    ALKPHOS 96  BILITOT 0.4  PROT 7.6  ALBUMIN 2.7*   No results found for this basename: LIPASE:2,AMYLASE:2 in the last 72 hours No results found for this basename: AMMONIA:2 in the last 72 hours CBC:  Basename 05/14/11 1240  WBC 14.5*  NEUTROABS 11.7*  HGB 11.0*  HCT 34.0*  MCV 97.4  PLT 296   Cardiac Enzymes:  Basename 05/14/11 1500  CKTOTAL --  CKMB --  CKMBINDEX --  TROPONINI <0.30   BNP: No results found for this basename: PROBNP:3 in the last  72 hours D-Dimer: No results found for this basename: DDIMER:2 in the last 72 hours CBG: No results found for this basename: GLUCAP:6 in the last 72 hours Hemoglobin A1C: No results found for this basename: HGBA1C in the last 72 hours Fasting Lipid Panel: No results found for this basename: CHOL,HDL,LDLCALC,TRIG,CHOLHDL,LDLDIRECT in the last 72 hours Thyroid Function Tests: No results found for this basename: TSH,T4TOTAL,FREET4,T3FREE,THYROIDAB in the last 72 hours Anemia Panel: No results found for this basename: VITAMINB12,FOLATE,FERRITIN,TIBC,IRON,RETICCTPCT in the last 72 hours Coagulation: No results found for this basename: LABPROT:2,INR:2 in the last 72 hours Urine Drug Screen: Drugs of Abuse  No results found for this basename: labopia, cocainscrnur, labbenz, amphetmu, thcu, labbarb    Alcohol Level: No results found for this basename: ETH:2 in the last 72 hours Urinalysis:  Basename 05/14/11 1507  COLORURINE YELLOW  LABSPEC 1.015  PHURINE 5.0  GLUCOSEU NEGATIVE  HGBUR NEGATIVE  BILIRUBINUR SMALL*  KETONESUR 15*  PROTEINUR NEGATIVE  UROBILINOGEN 0.2  NITRITE NEGATIVE  LEUKOCYTESUR SMALL*    Imaging results:  Dg Chest 1 View  05/14/2011  *RADIOLOGY REPORT*  Clinical Data: Cough.  CHEST - 1 VIEW  Comparison: 04/14/2010  Findings: There is hyperinflation of the lungs compatible with COPD.  Interstitial prominence has improved since prior study. Mild interstitial prominence persists which could represent  mild edema or chronic interstitial lung disease.  Heart is normal size. No confluent opacities or effusions.  IMPRESSION: COPD.  Improving interstitial prominence.  Original Report Authenticated By: Cyndie Chime, M.D.    Assessment & Plan: Active Problems:  UTI (urinary tract infection)  Dehydration  DM (diabetes mellitus), type 2, uncontrolled  Benign hypertension  Cough  Debility  Chronic diastolic heart failure  Renal insufficiency  Hearing impaired  Chronic anemia  Hypothyroidism  Patient will get ceftriaxone. Cough suppressants. No evidence of pneumonia on chest x-ray. Urine culture pending. Gentle IV hydration. Watch for signs of CHF. We'll place her on observation. When asked about CODE STATUS, she was unable to answer. Continue full code.  Jaydy Fitzhenry L 05/14/2011, 7:05 PM

## 2011-05-14 NOTE — ED Notes (Signed)
Pt sent over from [facility] with cough x 1 day.

## 2011-05-14 NOTE — ED Provider Notes (Signed)
History   This chart was scribed for Laray Anger, DO by Sofie Rower. The patient was seen in room APA14/APA14 and the patient's care was started at 2:31 PM     CSN: [id number]  Arrival date & time 05/14/11  1204   First MD Initiated Contact with Patient 05/14/11 1350     Level 5 Caveat : Dementia.   Chief Complaint  Patient presents with  . Cough     The history is provided by the nursing home. History Limited By: Hx dementia    Christina Douglas is a 78 y.o. female who presents to the Emergency Department complaining of gradual onset and persistence of "cough" for the past 1 day.  No reported fevers, no N/V/D.  Pt has hx dementia.   Pt is a resident from [facility].  PMD: Leanord Hawking Past Medical History  Diagnosis Date  . Diabetes mellitus   . Muscle weakness (generalized)   . Diastolic heart failure   . Hypertension   . Thyroid disease   . Kidney injury   . Dementia   . CHF (congestive heart failure)   . Anemia   . Hypothyroid   . Interstitial lung disorders   . Hyperlipidemia     Past Surgical History  Procedure Date  . Cataract extraction   . Tonsillectomy   . Total hip arthroplasty     History  Substance Use Topics  . Smoking status: Unknown If Ever Smoked  . Smokeless tobacco: Not on file  . Alcohol Use: No    Review of Systems  Unable to perform ROS: Dementia    Allergies  Penicillins  Home Medications   Current Outpatient Rx  Name Route Sig Dispense Refill  . ACETAMINOPHEN 325 MG PO TABS Oral Take 650 mg by mouth every 4 (four) hours as needed. Pain/fever    . RISAQUAD PO CAPS Oral Take 1 capsule by mouth 2 (two) times daily. 14 day course, should be completed by 05/17/11    . CHOLECALCIFEROL 2000 UNITS PO TABS Oral Take 1 tablet by mouth daily.    Marland Kitchen FLUTICASONE-SALMETEROL 100-50 MCG/DOSE IN AEPB Inhalation Inhale 1 puff into the lungs every 12 (twelve) hours.    . GUAIFENESIN-DM 100-10 MG/5ML PO SYRP Oral Take 10 mLs by mouth every 4 (four)  hours as needed. cough    . HYDROCODONE-ACETAMINOPHEN 5-325 MG PO TABS Oral Take 1 tablet by mouth 2 (two) times daily as needed. Moderate to severe pain    . INSULIN ASPART 100 UNIT/ML [place] SOLN Subcutaneous Inject 2-8 Units into the skin 3 (three) times daily before meals. Sliding scale//Accu-Checks before meals and at bedtime    . INSULIN GLARGINE 100 UNIT/ML [place] SOLN Subcutaneous Inject 8 Units into the skin at bedtime.    . IPRATROPIUM-ALBUTEROL 0.5-2.5 (3) MG/3ML IN SOLN Nebulization Take 3 mLs by nebulization every 6 (six) hours as needed. Shortness of breath    . LEVOTHYROXINE SODIUM 75 MCG PO TABS Oral Take 75 mcg by mouth daily.    Marland Kitchen LISINOPRIL 10 MG PO TABS Oral Take 10 mg by mouth daily. Hold for systolic BP <105    . ADULT MULTIVITAMIN W/MINERALS CH Oral Take 1 tablet by mouth daily.    . ADULT MULTIVITAMIN W/MINERALS CH Oral Take 1 tablet by mouth daily. Centrum Silver    . OMEPRAZOLE 20 MG PO CPDR Oral Take 20 mg by mouth daily.    Marland Kitchen POLYETHYLENE GLYCOL 3350 PO PACK Oral Take 17 g by mouth daily.    Marland Kitchen  LEVOFLOXACIN 250 MG PO TABS Oral Take 250 mg by mouth daily. Take for 9 days      BP 100/48  Pulse 90  Temp(Src) 97.8 F (36.6 C) (Axillary)  Resp 27  SpO2 98%  Physical Exam 1435: Physical examination:  Nursing notes reviewed; Vital signs and O2 SAT reviewed;  Constitutional: Thin, frail, In no acute distress; Head:  Normocephalic, atraumatic; Eyes: EOMI, PERRL, No scleral icterus; ENMT: Mouth and pharynx normal, Mucous membranes dry; Neck: Supple, Full range of motion, No lymphadenopathy; Cardiovascular: Regular rate and rhythm, No murmur or gallop; Respiratory: Breath sounds coarse & equal bilaterally, No wheezes, Normal respiratory effort/excursion; Chest: Nontender, Movement normal; Abdomen: Soft, Nontender, Nondistended, Normal bowel sounds; Extremities: Pulses normal, No tenderness, No edema, No calf edema or asymmetry.; Neuro: Awake, alert, confused re: time, place, events.   No facial droop. Moves all ext on stretcher spontaneously.; Skin: Color normal, Warm, Dry.     ED Course  Procedures     MDM  MDM Reviewed: nursing note, vitals and previous chart Reviewed previous: labs Interpretation: labs and x-ray   Results for orders placed during the hospital encounter of 05/14/11  CBC      Component Value Range   WBC 14.5 (*) 4.0 - 10.5 (K/uL)   RBC 3.49 (*) 3.87 - 5.11 (MIL/uL)   Hemoglobin 11.0 (*) 12.0 - 15.0 (g/dL)   HCT 16.1 (*) 09.6 - 46.0 (%)   MCV 97.4  78.0 - 100.0 (fL)   MCH 31.5  26.0 - 34.0 (pg)   MCHC 32.4  30.0 - 36.0 (g/dL)   RDW 04.5  40.9 - 81.1 (%)   Platelets 296  150 - 400 (K/uL)  DIFFERENTIAL      Component Value Range   Neutrophils Relative 81 (*) 43 - 77 (%)   Neutro Abs 11.7 (*) 1.7 - 7.7 (K/uL)   Lymphocytes Relative 11 (*) 12 - 46 (%)   Lymphs Abs 1.7  0.7 - 4.0 (K/uL)   Monocytes Relative 7  3 - 12 (%)   Monocytes Absolute 1.0  0.1 - 1.0 (K/uL)   Eosinophils Relative 1  0 - 5 (%)   Eosinophils Absolute 0.1  0.0 - 0.7 (K/uL)   Basophils Relative 0  0 - 1 (%)   Basophils Absolute 0.0  0.0 - 0.1 (K/uL)  COMPREHENSIVE METABOLIC PANEL      Component Value Range   Sodium 136  135 - 145 (mEq/L)   Potassium 5.0  3.5 - 5.1 (mEq/L)   Chloride 100  96 - 112 (mEq/L)   CO2 25  19 - 32 (mEq/L)   Glucose, Bld 113 (*) 70 - 99 (mg/dL)   BUN 59 (*) 6 - 23 (mg/dL)   Creatinine, Ser 9.14 (*) 0.50 - 1.10 (mg/dL)   Calcium 78.2 (*) 8.4 - 10.5 (mg/dL)   Total Protein 7.6  6.0 - 8.3 (g/dL)   Albumin 2.7 (*) 3.5 - 5.2 (g/dL)   AST 23  0 - 37 (U/L)   ALT 7  0 - 35 (U/L)   Alkaline Phosphatase 96  39 - 117 (U/L)   Total Bilirubin 0.4  0.3 - 1.2 (mg/dL)   GFR calc non Af Amer 29 (*) >90 (mL/min)   GFR calc Af Amer 33 (*) >90 (mL/min)  LACTIC ACID, PLASMA      Component Value Range   Lactic Acid, Venous 0.9  0.5 - 2.2 (mmol/L)  URINALYSIS, ROUTINE W REFLEX MICROSCOPIC      Component Value Range  Color, Urine YELLOW  YELLOW     APPearance CLOUDY (*) CLEAR    Specific Gravity, Urine 1.015  1.005 - 1.030    pH 5.0  5.0 - 8.0    Glucose, UA NEGATIVE  NEGATIVE (mg/dL)   Hgb urine dipstick NEGATIVE  NEGATIVE    Bilirubin Urine SMALL (*) NEGATIVE    Ketones, ur 15 (*) NEGATIVE (mg/dL)   Protein, ur NEGATIVE  NEGATIVE (mg/dL)   Urobilinogen, UA 0.2  0.0 - 1.0 (mg/dL)   Nitrite NEGATIVE  NEGATIVE    Leukocytes, UA SMALL (*) NEGATIVE   URINE MICROSCOPIC-ADD ON      Component Value Range   Squamous Epithelial / LPF FEW (*) RARE    WBC, UA 21-50  <3 (WBC/hpf)   RBC / HPF 3-6  <3 (RBC/hpf)   Bacteria, UA FEW (*) RARE    Casts HYALINE CASTS (*) NEGATIVE    Results for AARIKA, MOON (MRN [id number]) as of 05/14/2011 17:55  Ref. Range 04/16/2010 04:28 04/17/2010 04:24 05/14/2011 12:40  BUN Latest Range: 6-23 mg/dL 32 (H) 26 (H) 59 (H)  Creat Latest Range: 0.50-1.10 mg/dL 4.09 8.11 9.14 (H)    Results for DISHA, COTTAM (MRN [id number]) as of 05/14/2011 17:55  Ref. Range 04/16/2010 04:28 04/17/2010 04:24 05/14/2011 12:40  HGB Latest Range: 12.0-15.0 g/dL 9.2 (L) 8.7 (L) 08.6 (L)  HCT Latest Range: 36.0-46.0 % 29.2 (L) 27.8 (L) 34.0 (L)    Dg Chest 1 View 05/14/2011  *RADIOLOGY REPORT*  Clinical Data: Cough.  CHEST - 1 VIEW  Comparison: 04/14/2010  Findings: There is hyperinflation of the lungs compatible with COPD.  Interstitial prominence has improved since prior study. Mild interstitial prominence persists which could represent mild edema or chronic interstitial lung disease.  Heart is normal size. No confluent opacities or effusions.  IMPRESSION: COPD.  Improving interstitial prominence.  Original Report Authenticated By: Cyndie Chime, M.D.    1630:  +UTI, UC pending.  EKG and labs pending.  No fevers in ED, though SBP ranging 90's to 100's.  Sign out to Dr. Deretha Emory.       I personally performed the services described in this documentation, which was scribed in my presence. The recorded information has been  reviewed and considered. Algernon Mundie Allison Quarry, DO 05/14/11 2003

## 2011-05-14 NOTE — ED Notes (Signed)
Pt returned from xray

## 2011-05-14 NOTE — Progress Notes (Signed)
Skin Assessment performed upon admission.  Bilateral heels noted to be pink as well as sacrum.  Dry skin.   M.Hurd, RN & J. Renae Gloss, RN

## 2011-05-14 NOTE — ED Notes (Signed)
Offered patient food tray, patient denied wanting to eat at this time.

## 2011-05-15 ENCOUNTER — Encounter (HOSPITAL_COMMUNITY): Payer: Self-pay | Admitting: *Deleted

## 2011-05-15 LAB — CBC
HCT: 31 % — ABNORMAL LOW (ref 36.0–46.0)
Hemoglobin: 10.1 g/dL — ABNORMAL LOW (ref 12.0–15.0)
MCH: 31.8 pg (ref 26.0–34.0)
MCV: 97.5 fL (ref 78.0–100.0)
Platelets: 292 10*3/uL (ref 150–400)
RBC: 3.18 MIL/uL — ABNORMAL LOW (ref 3.87–5.11)

## 2011-05-15 LAB — BASIC METABOLIC PANEL
BUN: 54 mg/dL — ABNORMAL HIGH (ref 6–23)
CO2: 24 mEq/L (ref 19–32)
Calcium: 9.8 mg/dL (ref 8.4–10.5)
Creatinine, Ser: 1.41 mg/dL — ABNORMAL HIGH (ref 0.50–1.10)
Glucose, Bld: 86 mg/dL (ref 70–99)

## 2011-05-15 LAB — GLUCOSE, CAPILLARY: Glucose-Capillary: 83 mg/dL (ref 70–99)

## 2011-05-15 MED ORDER — CEFUROXIME AXETIL 500 MG PO TABS: 500.0000 mg | ORAL_TABLET | Freq: Two times a day (BID) | ORAL | Status: AC

## 2011-05-15 MED ORDER — DM-GUAIFENESIN ER 30-600 MG PO TB12: 1.0000 | ORAL_TABLET | Freq: Two times a day (BID) | ORAL | Status: AC | PRN

## 2011-05-15 NOTE — Progress Notes (Signed)
Patient discharged back to [facility] via EMS.  Switched to PO antibiotics.  Packet sent with patient

## 2011-05-15 NOTE — Progress Notes (Signed)
Clinical Social Worker completed psychosocial assessment. Patient is from [facility] and plan is to return today for discharge. CSW faciliated discharge by contacting facility and family. CSW left a message for daughter Tomma Lightning. Patient will be transported via EMS if CSW does not receive return phone call from daughter. CSW will sign off as social work intervention is no longer needed.   Rozetta Nunnery MSW, [employer] (Weekend Coverage) [phone]

## 2011-05-15 NOTE — Discharge Summary (Signed)
Physician Discharge Summary  Patient ID: Christina Douglas MRN: [id number] DOB/AGE: 1913-07-06 78 y.o.  Admit date: 05/14/2011 Discharge date: 05/15/2011  Discharge Diagnoses:  Active Problems:  UTI (urinary tract infection)  Dehydration  DM (diabetes mellitus), type 2, uncontrolled  Benign hypertension  Cough  Debility  Chronic diastolic heart failure  Renal insufficiency  Hearing impaired  Chronic anemia  Hypothyroidism   Medication List  As of 05/15/2011  9:31 AM   STOP taking these medications         guaiFENesin-dextromethorphan 100-10 MG/5ML syrup      insulin glargine 100 UNIT/ML injection      levofloxacin 250 MG tablet         TAKE these medications         acetaminophen 325 MG tablet   Commonly known as: TYLENOL   Take 650 mg by mouth every 4 (four) hours as needed. Pain/fever      acidophilus Caps   Take 1 capsule by mouth 2 (two) times daily. 14 day course, should be completed by 05/17/11      cefUROXime 500 MG tablet   Commonly known as: CEFTIN   Take 1 tablet (500 mg total) by mouth 2 (two) times daily. For 6 days      Cholecalciferol 2000 UNITS Tabs   Take 1 tablet by mouth daily.      dextromethorphan-guaiFENesin 30-600 MG per 12 hr tablet   Commonly known as: MUCINEX DM   Take 1 tablet by mouth 2 (two) times daily as needed (cough).      Fluticasone-Salmeterol 100-50 MCG/DOSE Aepb   Commonly known as: ADVAIR   Inhale 1 puff into the lungs every 12 (twelve) hours.      HYDROcodone-acetaminophen 5-325 MG per tablet   Commonly known as: NORCO   Take 1 tablet by mouth 2 (two) times daily as needed. Moderate to severe pain      insulin aspart 100 UNIT/ML injection   Commonly known as: novoLOG   Inject 2-8 Units into the skin 3 (three) times daily before meals. Sliding scale//Accu-Checks before meals and at bedtime      ipratropium-albuterol 0.5-2.5 (3) MG/3ML Soln   Commonly known as: DUONEB   Take 3 mLs by nebulization every 6 (six) hours as  needed. Shortness of breath      levothyroxine 75 MCG tablet   Commonly known as: SYNTHROID, LEVOTHROID   Take 75 mcg by mouth daily.      lisinopril 10 MG tablet   Commonly known as: PRINIVIL,ZESTRIL   Take 10 mg by mouth daily. Hold for systolic BP <105      mulitivitamin with minerals Tabs   Take 1 tablet by mouth daily.      omeprazole 20 MG capsule   Commonly known as: PRILOSEC   Take 20 mg by mouth daily.      polyethylene glycol packet   Commonly known as: MIRALAX / GLYCOLAX   Take 17 g by mouth daily.            Discharge Orders    Future Orders Please Complete By Expires   Diet Carb Modified      Walk with assistance         Follow-up Information    Follow up with Terald Sleeper, MD .         Disposition: 03-Skilled Nursing Facility  Discharged Condition: stable  Consults:  social work  Labs:   Results for orders placed during the hospital encounter of 05/14/11 (  from the past 48 hour(s))  CBC     Status: Abnormal   Collection Time   05/14/11 12:40 PM      Component Value Range Comment   WBC 14.5 (*) 4.0 - 10.5 (K/uL)    RBC 3.49 (*) 3.87 - 5.11 (MIL/uL)    Hemoglobin 11.0 (*) 12.0 - 15.0 (g/dL)    HCT 16.1 (*) 09.6 - 46.0 (%)    MCV 97.4  78.0 - 100.0 (fL)    MCH 31.5  26.0 - 34.0 (pg)    MCHC 32.4  30.0 - 36.0 (g/dL)    RDW 04.5  40.9 - 81.1 (%)    Platelets 296  150 - 400 (K/uL)   DIFFERENTIAL     Status: Abnormal   Collection Time   05/14/11 12:40 PM      Component Value Range Comment   Neutrophils Relative 81 (*) 43 - 77 (%)    Neutro Abs 11.7 (*) 1.7 - 7.7 (K/uL)    Lymphocytes Relative 11 (*) 12 - 46 (%)    Lymphs Abs 1.7  0.7 - 4.0 (K/uL)    Monocytes Relative 7  3 - 12 (%)    Monocytes Absolute 1.0  0.1 - 1.0 (K/uL)    Eosinophils Relative 1  0 - 5 (%)    Eosinophils Absolute 0.1  0.0 - 0.7 (K/uL)    Basophils Relative 0  0 - 1 (%)    Basophils Absolute 0.0  0.0 - 0.1 (K/uL)   COMPREHENSIVE METABOLIC PANEL     Status:  Abnormal   Collection Time   05/14/11 12:40 PM      Component Value Range Comment   Sodium 136  135 - 145 (mEq/L)    Potassium 5.0  3.5 - 5.1 (mEq/L)    Chloride 100  96 - 112 (mEq/L)    CO2 25  19 - 32 (mEq/L)    Glucose, Bld 113 (*) 70 - 99 (mg/dL)    BUN 59 (*) 6 - 23 (mg/dL)    Creatinine, Ser 9.14 (*) 0.50 - 1.10 (mg/dL)    Calcium 78.2 (*) 8.4 - 10.5 (mg/dL)    Total Protein 7.6  6.0 - 8.3 (g/dL)    Albumin 2.7 (*) 3.5 - 5.2 (g/dL)    AST 23  0 - 37 (U/L)    ALT 7  0 - 35 (U/L)    Alkaline Phosphatase 96  39 - 117 (U/L)    Total Bilirubin 0.4  0.3 - 1.2 (mg/dL)    GFR calc non Af Amer 29 (*) >90 (mL/min)    GFR calc Af Amer 33 (*) >90 (mL/min)   PROCALCITONIN     Status: Normal   Collection Time   05/14/11  2:57 PM      Component Value Range Comment   Procalcitonin 0.19     LACTIC ACID, PLASMA     Status: Normal   Collection Time   05/14/11  2:57 PM      Component Value Range Comment   Lactic Acid, Venous 0.9  0.5 - 2.2 (mmol/L)   TROPONIN I     Status: Normal   Collection Time   05/14/11  3:00 PM      Component Value Range Comment   Troponin I <0.30  <0.30 (ng/mL)   TYPE AND SCREEN     Status: Normal   Collection Time   05/14/11  3:00 PM      Component Value Range Comment   ABO/RH(D) B POS  Antibody Screen NEG      Sample Expiration 05/17/2011     URINALYSIS, ROUTINE W REFLEX MICROSCOPIC     Status: Abnormal   Collection Time   05/14/11  3:07 PM      Component Value Range Comment   Color, Urine YELLOW  YELLOW     APPearance CLOUDY (*) CLEAR     Specific Gravity, Urine 1.015  1.005 - 1.030     pH 5.0  5.0 - 8.0     Glucose, UA NEGATIVE  NEGATIVE (mg/dL)    Hgb urine dipstick NEGATIVE  NEGATIVE     Bilirubin Urine SMALL (*) NEGATIVE     Ketones, ur 15 (*) NEGATIVE (mg/dL)    Protein, ur NEGATIVE  NEGATIVE (mg/dL)    Urobilinogen, UA 0.2  0.0 - 1.0 (mg/dL)    Nitrite NEGATIVE  NEGATIVE     Leukocytes, UA SMALL (*) NEGATIVE    URINE MICROSCOPIC-ADD ON      Status: Abnormal   Collection Time   05/14/11  3:07 PM      Component Value Range Comment   Squamous Epithelial / LPF FEW (*) RARE     WBC, UA 21-50  <3 (WBC/hpf)    RBC / HPF 3-6  <3 (RBC/hpf)    Bacteria, UA FEW (*) RARE     Casts HYALINE CASTS (*) NEGATIVE    MRSA PCR SCREENING     Status: Normal   Collection Time   05/14/11  7:37 PM      Component Value Range Comment   MRSA by PCR NEGATIVE  NEGATIVE    GLUCOSE, CAPILLARY     Status: Normal   Collection Time   05/14/11  8:43 PM      Component Value Range Comment   Glucose-Capillary 92  70 - 99 (mg/dL)    Comment 1 Documented in Chart      Comment 2 Notify RN     CBC     Status: Abnormal   Collection Time   05/15/11  6:45 AM      Component Value Range Comment   WBC 13.4 (*) 4.0 - 10.5 (K/uL)    RBC 3.18 (*) 3.87 - 5.11 (MIL/uL)    Hemoglobin 10.1 (*) 12.0 - 15.0 (g/dL)    HCT 16.1 (*) 09.6 - 46.0 (%)    MCV 97.5  78.0 - 100.0 (fL)    MCH 31.8  26.0 - 34.0 (pg)    MCHC 32.6  30.0 - 36.0 (g/dL)    RDW 04.5  40.9 - 81.1 (%)    Platelets 292  150 - 400 (K/uL)   BASIC METABOLIC PANEL     Status: Abnormal   Collection Time   05/15/11  6:45 AM      Component Value Range Comment   Sodium 139  135 - 145 (mEq/L)    Potassium 4.6  3.5 - 5.1 (mEq/L)    Chloride 105  96 - 112 (mEq/L)    CO2 24  19 - 32 (mEq/L)    Glucose, Bld 86  70 - 99 (mg/dL)    BUN 54 (*) 6 - 23 (mg/dL)    Creatinine, Ser 9.14 (*) 0.50 - 1.10 (mg/dL)    Calcium 9.8  8.4 - 10.5 (mg/dL)    GFR calc non Af Amer 31 (*) >90 (mL/min)    GFR calc Af Amer 35 (*) >90 (mL/min)   GLUCOSE, CAPILLARY     Status: Normal   Collection Time   05/15/11  7:22 AM      Component Value Range Comment   Glucose-Capillary 83  70 - 99 (mg/dL)    Comment 1 Notify RN       Diagnostics:  Dg Chest 1 View  05/14/2011  *RADIOLOGY REPORT*  Clinical Data: Cough.  CHEST - 1 VIEW  Comparison: 04/14/2010  Findings: There is hyperinflation of the lungs compatible with COPD.  Interstitial  prominence has improved since prior study. Mild interstitial prominence persists which could represent mild edema or chronic interstitial lung disease.  Heart is normal size. No confluent opacities or effusions.  IMPRESSION: COPD.  Improving interstitial prominence.  Original Report Authenticated By: Cyndie Chime, M.D.    EKG: Sinus rhythm with occasional Premature ventricular complexes Left anterior fascicular block Moderate voltage criteria for LVH, may be normal variant Cannot rule out Septal infarct , age undetermined  Full Code   Hospital Course: See H&P for complete admission details. The patient is a 78 year old white female from skilled nursing facility who reportedly was sent over for cough. Patient is very hard of hearing and unable to provide much history. In the emergency room, she was afebrile, had normal vital signs and oxygen saturation. Chest x-ray showed no pneumonia. She did have a leukocytosis and urinary tract infection. She was noted to be coughing initially but this has improved. She has had clear lung sounds. She was monitored overnight on observation. Started on ceftriaxone. Apparently, she had been on levofloxacin since the first of this month. She was started on cough suppressants and gentle iv hydration. She is tolerating a diet, continues to have normal vital signs, clear lung sounds. Her cough seems improved today. Urine culture is pending, but she is medically stable for discharge back to skilled nursing facility. I changed her antibiotic to Ceftin in case she has a fluoroquinolone resistant bacterial urinary tract infection. Urine culture will need to be followed up as an outpatient. Her other medical problems remained stable during hospitalization.  Discharge Exam:  Blood pressure 113/62, pulse 90, temperature 98.5 F (36.9 C), temperature source Axillary, resp. rate 20, height 5\' 4"  (1.626 m), weight 46.2 kg (101 lb 13.6 oz), SpO2 97.00%.  Unchanged from  05/14/2011   Signed: Crista Curb L 05/15/2011, 9:31 AM

## 2011-05-15 NOTE — Progress Notes (Signed)
Clinical Social Work Department BRIEF PSYCHOSOCIAL ASSESSMENT 05/15/2011  Patient:  Christina Douglas, Christina Douglas     Account Number:  [id number]     Admit date:  05/14/2011  Clinical Social Worker:  Hulan Fray  Date/Time:  05/15/2011 11:16 AM  Referred by:  RN  Date Referred:  05/14/2011 Referred for  Other - See comment   Other Referral:   Admitted from SNF   Interview type:  Other - See comment Other interview type:   Patient and RN    PSYCHOSOCIAL DATA Living Status:  FACILITY Admitted from facility:  [facility] Level of care:  Skilled Nursing Facility Primary support name:  Tomma Lightning Primary support relationship to patient:  CHILD, ADULT Degree of support available:   good    CURRENT CONCERNS Current Concerns  None Noted   Other Concerns:    SOCIAL WORK ASSESSMENT / PLAN Clinical Social Worker received referral for patient admitted from a facility. Patient is from [facility] and is ready for discharge today. CSW called facility and they are able to receive patient today. CSW called Tomma Lightning, but left a message to return call. CSW completed FL2 for MD's signature. CSW will facilitate discharge back to facility.   Assessment/plan status:  Other - See comment Other assessment/ plan:   CSW will facilitate discharge back to facility.   Information/referral to community resources:    PATIENT'S/FAMILY'S RESPONSE TO PLAN OF CARE: Patient was not very verbal with CSW during assessment. Patient was hard of hearing.

## 2011-05-17 LAB — URINE CULTURE: Culture  Setup Time: 201304130222

## 2011-06-11 NOTE — Progress Notes (Signed)
UR completed. Christina Douglas 

## 2011-09-28 ENCOUNTER — Other Ambulatory Visit (HOSPITAL_BASED_OUTPATIENT_CLINIC_OR_DEPARTMENT_OTHER): Payer: Self-pay | Admitting: Internal Medicine

## 2011-09-28 DIAGNOSIS — R1310 Dysphagia, unspecified: Secondary | ICD-10-CM

## 2011-09-30 ENCOUNTER — Ambulatory Visit (HOSPITAL_COMMUNITY)
Admission: RE | Admit: 2011-09-30 | Discharge: 2011-09-30 | Disposition: A | Discharge status: Home / Self Care | Payer: Medicare Other | Source: Ambulatory Visit | Attending: Internal Medicine | Admitting: Internal Medicine

## 2011-09-30 ENCOUNTER — Other Ambulatory Visit (HOSPITAL_BASED_OUTPATIENT_CLINIC_OR_DEPARTMENT_OTHER): Payer: Self-pay | Admitting: Internal Medicine

## 2011-09-30 DIAGNOSIS — R1310 Dysphagia, unspecified: Secondary | ICD-10-CM | POA: Insufficient documentation

## 2011-09-30 DIAGNOSIS — IMO0001 Reserved for inherently not codable concepts without codable children: Secondary | ICD-10-CM | POA: Insufficient documentation

## 2011-09-30 DIAGNOSIS — E785 Hyperlipidemia, unspecified: Secondary | ICD-10-CM | POA: Insufficient documentation

## 2011-09-30 DIAGNOSIS — I10 Essential (primary) hypertension: Secondary | ICD-10-CM | POA: Insufficient documentation

## 2011-09-30 DIAGNOSIS — E119 Type 2 diabetes mellitus without complications: Secondary | ICD-10-CM | POA: Insufficient documentation

## 2011-09-30 NOTE — Procedures (Signed)
Objective Swallowing Evaluation: Modified Barium Swallowing Study  Patient Details  Name: Christina Douglas MRN: [id number] Date of Birth: 01-02-1914  Today's Date: 09/30/2011 Time:  - 1422    Past Medical History:  Past Medical History  Diagnosis Date  . Diabetes mellitus   . Muscle weakness (generalized)   . Diastolic heart failure   . Hypertension   . Thyroid disease   . Kidney injury   . Dementia   . CHF (congestive heart failure)   . Anemia   . Hypothyroid   . Interstitial lung disorders   . Hyperlipidemia    Past Surgical History:  Past Surgical History  Procedure Date  . Cataract extraction   . Tonsillectomy   . Total hip arthroplasty    HPI:  78 yo female resident from [facility] referred for MBSS due to reports of pt being unable to swallow foods/liquids. Pt. denies dysphagia.  Symptoms/Limitations Symptoms: "Unable to swallow foods and liquids." Special Tests: MBSS  Recommendation/Prognosis  Clinical Impression Dysphagia Diagnosis: Within Functional Limits;Mild pharyngeal phase dysphagia Clinical impression: Pt with one episode of trace aspiration when using straw. Reflexive cough elicited after entering trachea and removed. Penetration of thin liquids occurred x1 when taking barium tablet. Recommend regular diet with thin liquids. NO STRAWS, present meds whole in puree or thickened liquids. Swallow Evaluation Recommendations Diet Recommendations: Regular;Thin liquid Liquid Administration via: Cup;No straw Medication Administration: Whole meds with puree (or whole with thickened liquids) Supervision: Patient able to self feed Postural Changes and/or Swallow Maneuvers: Out of bed for meals;Seated upright 90 degrees;Upright 30-60 min after meal Oral Care Recommendations: Oral care BID Follow up Recommendations: None   Individuals Consulted Consulted and Agree with Results and Recommendations: Patient Report Sent to : Referring physician;Facility (Comment)  [facility])  SLP Assessment/Plan Dysphagia Diagnosis: Within Functional Limits;Mild pharyngeal phase dysphagia Clinical impression: Pt with one episode of trace aspiration when using straw. Reflexive cough elicited after entering trachea and removed. Penetration of thin liquids occurred x1 when taking barium tablet. Recommend regular diet with thin liquids. NO STRAWS, present meds whole in puree or thickened liquids.  General:  Date of Onset: 09/30/11 HPI: 78 yo female resident from [facility] referred for MBSS due to reports of pt being unable to swallow foods/liquids. Pt. denies dysphagia. Type of Study: Modified Barium Swallowing Study Reason for Referral: Objectively evaluate swallowing function Diet Prior to this Study: Information not available Temperature Spikes Noted: No Respiratory Status: Room air History of Recent Intubation: No Behavior/Cognition: Alert;Cooperative;Pleasant mood Oral Cavity - Dentition: Adequate natural dentition Oral Motor / Sensory Function: Within functional limits Self-Feeding Abilities: Needs assist Patient Positioning: Upright in chair Baseline Vocal Quality: Clear Volitional Cough: Strong Volitional Swallow: Able to elicit Anatomy: Within functional limits Pharyngeal Secretions: Not observed secondary MBS  Reason for Referral:  Objectively evaluate swallowing function   Oral Phase Oral Preparation/Oral Phase Oral Phase: WFL Pharyngeal Phase  Pharyngeal Phase Pharyngeal Phase: Impaired Pharyngeal - Thin Pharyngeal - Thin Straw: Premature spillage to pyriform sinuses;Reduced airway/laryngeal closure;Penetration/Aspiration during swallow;Penetration/Aspiration after swallow;Trace aspiration Penetration/Aspiration details (thin straw): Material enters airway, passes BELOW cords then ejected out;Material enters airway, remains ABOVE vocal cords and not ejected out Pharyngeal - Solids Pharyngeal - Pill:  (Penetration of thin liquids when  taking pill) Cervical Esophageal Phase  Cervical Esophageal Phase Cervical Esophageal Phase: WFL  GN Intial: CI Goal: CI Discharge: CI  Thank you,  Havery Moros, CCC-SLP [phone]  Jaequan Propes 09/30/2011, 3:06 PM

## 2012-08-23 ENCOUNTER — Non-Acute Institutional Stay (SKILLED_NURSING_FACILITY): Payer: Medicare Other | Admitting: Internal Medicine

## 2012-08-23 DIAGNOSIS — K219 Gastro-esophageal reflux disease without esophagitis: Secondary | ICD-10-CM

## 2012-08-23 DIAGNOSIS — I5032 Chronic diastolic (congestive) heart failure: Secondary | ICD-10-CM

## 2012-08-23 DIAGNOSIS — D649 Anemia, unspecified: Secondary | ICD-10-CM

## 2012-08-23 DIAGNOSIS — M810 Age-related osteoporosis without current pathological fracture: Secondary | ICD-10-CM

## 2012-08-23 DIAGNOSIS — N289 Disorder of kidney and ureter, unspecified: Secondary | ICD-10-CM

## 2012-08-23 DIAGNOSIS — E039 Hypothyroidism, unspecified: Secondary | ICD-10-CM

## 2012-08-23 DIAGNOSIS — I10 Essential (primary) hypertension: Secondary | ICD-10-CM

## 2012-08-23 DIAGNOSIS — E1165 Type 2 diabetes mellitus with hyperglycemia: Secondary | ICD-10-CM

## 2012-08-23 DIAGNOSIS — J069 Acute upper respiratory infection, unspecified: Secondary | ICD-10-CM

## 2012-08-23 NOTE — Progress Notes (Signed)
Patient ID: Christina Douglas, female   DOB: 02-11-1913, 79 y.o.   MRN: [id number]  This is an acute-routine visit.  Facility [facility].  Level of care skilled.  Chief complaint-acute visit followup respiratory issues question URI-medical management of hypothyroidism CHF diabetes type 2 hypertension.  History of present illness.  Patient is a very pleasant 79 year old female with the above diagnoses.  Apparently she had some increased cough and a temperature of 100.6 several days ago-studies were ordered including a chest x-ray which showed mild pulmonary venous congestion CHF-she was given 40 mg of Lasix for 3 days she has completed this-she is also on Levaquin for suspected URI that was started at on July 18.  Patient says she feels much better and staff agrees with this.  Her vital signs are stable he does not complaining of any shortness of breath-occasional cough she is on Mucinex she appears essentially back at her baseline.  She does have a previous listed history of diastolic CHF which has been quite stable during her stay here.  He also is a type II diabetic basically has NovoLog insulin before lunch and dinner her blood sugars do appear somewhat elevated in the mid 100s in the a.m. however later day parts rise  to the mid 100s low 200s rising to fairly consistently-2-300s at some poitst later in the day  She is on NovoLog 2 units before dinner and lunch suspect we will increase this.  Her vital signs continued to be stable she does have a low-grade temperature 99.1 again she is completing a course of Levaquin for suspicious there may be some bronchitis associated with this with a previous history of this. She does have a history of interstitial lung disease she continues on Advair as well as Spiriva.  Her blood pressures appear to be stable as well most recently 116/50-134/60-she had been on an ACE inhibitor that was discontinued secondary to renal issues.  She initially came  here after sustaining a femoral neck fracture of her right hip she underwent an ORIF this appears to be stable.  Family medical social history has been reviewed her previous progress note 03/29/1998 413 she has been long-term resident this facility.  Medications have been reviewed per MAR.  Review of systems.  In general denies fever chills says she feels much better.  Skin-does have a small bruise on her right dorsal aspect of her hand-this apparently was from a blood draw.  Head ears eyes nose mouth and throat-does not complain of any sore throat visual changes or nasal discharge.  Respiratory-does not complaining of shortness of breath occasionally will have cough but this apparently is no greater than her baseline.  Heart denies any chest pain or palpitations.  GI-no complaints of nausea vomiting diarrhea constipation. Has somewhat spotty appetite per nursing staff she eats what she likes  GU-no complaints of dysuria.  Musculoskeletal has general frailty but did not complain of any joint pain.  Neurologic-does not complain of any headache dizziness or syncopal-type feelings of.  Psych has no complaints she continues to be pleasant and engaged.  Physical exam.  Temperature is 99.1 pulse 63 respirations 16 blood pressure 116/50 weight is 133 this is stable her O2 saturation is 95% on room air.  In general this is a somewhat frail very pleasant elderly female in no distress lying comfortably in bed she is pleasant smiling laughing.  Skin is warm and dry there is a violaceous bruise dorsal aspect of her right hand this does not  appear to be anything acute.  Head ears eyes nose mouth and throat-oropharynx clear dentition is poor mucous membranes are moist S. pupils appear equal round reactive to light extraocular movements intact.  Chest slight crackles at the bases but this is quite minimal somewhat shallow air entry this is not new however.  Her heart is regular rate and  rhythm with a 2/6 systolic ejection murmur I do not note any elevated JVP she has trace lower extremity edema this is pretty much her baseline as well.  Abdomen soft nontender with active bowel sounds.  Muscle skeletal does move all her extremities x4 has general frailty this appears to be at her baseline I did not note any deformities other than diffuse arthritic changes.  Neurologic is grossly intact no lateralizing findings her speech is clear.  Psych she is alert and oriented x1 pleasant and appropriate very engaging individual.  Labs 08/19/2012.  WBC 9.5 hemoglobin 10.4 platelets 207.  Sodium 134 potassium 3.9 BUN 34 creatinine 1.35.  .    07/31/2012.  TSH-2.610.  Hemoglobin A1c 7.2.  04/25/2012.  WBC 5.9 hemoglobin 10.5 platelets 188.  Sodium 141 potassium 4.4 BUN 33 creatinine 1.09.  Liver function tests within normal limits  Assessment and plan.  #1-CHF-she has completed a short course of Lasix this appears to be improved Will continue to monitor check her weights q. weekly notify provider gain greater than 3 pounds clinically she appears stable.  #2-URI? She is completing a course of Levaquin also has Mucinex and also duo nebs when necessary this appears to be resolving she does have a low-grade temperature although this apparently is somewhat variable certainly clinically she does not appear to be in any distress  #3-history of renal insufficiency--creatinines that have been quite variable for some time she appears to be at the higher end of her normal range recently will update this this week especially since she has been on Lasix short course.   #4-diabetes type 2-she is only on NovoLog 2 units at lunch and dinner her blood sugars are fairly elevated later in the day --Will start her on low-dose Levemir 5 units every morning and monitor .  #5-hypothyroidism this is stable she is on supplementation recent TSH was within normal limits  #6-history right hip  fracture likely osteoporosis-  on vitamin D-will check a vitamin D level-pending results will likely start calcium  Anemia-this appears to be relatively at baseline I suspect this is of chronic disease.  #8-possible history of interstitial lung disease status again she is on Advair as well as Spiriva.  #9 history of GERD-this appears to be improving we have started her on Prilosec she does not complaining of GERD-like symptoms today.   [id number] note greater than 40 minutes spent assessing patient and coordinating and formulating a plan of care for numerous diagnoses- greater than 50% of time spent coordinating and formulating plan of care

## 2012-09-07 ENCOUNTER — Non-Acute Institutional Stay (SKILLED_NURSING_FACILITY): Payer: Medicare Other | Admitting: Internal Medicine

## 2012-09-07 DIAGNOSIS — E1165 Type 2 diabetes mellitus with hyperglycemia: Secondary | ICD-10-CM

## 2012-09-07 DIAGNOSIS — R197 Diarrhea, unspecified: Secondary | ICD-10-CM

## 2012-09-07 DIAGNOSIS — N289 Disorder of kidney and ureter, unspecified: Secondary | ICD-10-CM

## 2012-09-07 NOTE — Progress Notes (Signed)
Patient ID: Christina Douglas, female   DOB: 1913-03-23, 79 y.o.   MRN: [id number]  This is an acute visit.  Level of care skilled.  Facility [facility].  Chief complaint-acute visit followup renal insufficiency-diabetes-diarrhea.  History of present illness.  Patient is a very pleasant elderly resident with the above issues.  She does have a history of renal insufficiency--creatinine was somewhat elevated at 1.35.  She did receive a course of Lasix late last month for some x-ray which showed some venous congestion-.  Respiratory wise she does appear stable.  And updated lab shows creatinine is now down to 1.15 which is encouraging.  She also has developed apparently some diarrhea she was on Levaquin recently for suspected URI-she does not really complain of abdominal pain although I did note on exam there is some tenderness to palpation.  She also is a type II diabetic these and then relatively benign it did look at her blood sugars Mildly elevated 167-200 in the morning.  1 73-280 at noon.  157-192 at 4 PM.  159-252 at at bedtime  She does get NovoLog 2 units before lunch and dinner.  Currently she has no acute complaints other than the diarrhea.  Other medical social history as been reviewed most recently progress note on 08/23/2012.  Medications have been reviewed per MAR.  Review of systems.  In general denies any fever chills.  Head ears eyes nose mouth and throat-does not complaining of visual changes or sore throat or nasal discharge.  Respiratory does not complaining of shortness of breath or cough today.  Cardiac-no complaints of chest pain she does have some history CHF and edema at times.  GI-has diarrhea does not specifically complain of abdominal pain nausea or vomiting.  Muscle skeletal does not complaining of joint pain.  Neurologic denies any headache or dizziness.  Psych-continues to be pleasant smiling.  Physical exam.  Temperature is 97.8  pulse 82 respirations 18 blood pressure 114/74-142/68-in this range O2 saturation is 98% on room air.  In general this is a somewhat frail elderly female in no distress lying comfortably in bed.  Her skin is warm and dry.  Oropharynx clear mucous membranes moist.  Chest is clear to auscultation without rhonchi rales or wheezes.  Heart is regular rate and rhythm with a 2/6 systolic murmur.  Abdomen is soft somewhat diffusely tender to palpation although not horribly acutely so certainly there are positive bowel sounds.  Extremities does have general frailty arthritic changes in all limbs she does not really have significant edema at this time.  Labs.  09/04/12.    Sodium 138 potassium 5 BUN 30 creatinine shows improvement at 1.15 previously on July 19 was 1.35.  08/21/2012.  WBC 9.5 hemoglobin 10.4 platelets 207.  Assessment and plan.  #1-diarrhea-with history of antibiotic use would be suspicious of C. difficile will check a culture also will check a CBC to see work the white count stands.  #2-renal insufficiency this appears improved we'll get a basic metabolic panel next week to make sure this remains stable.  #3-history diabetes type 2 Will mildly increased been NovoLog before meals to 3 units-will be somewhat conservative secondary to patient's advanced age and comorbidities it would be nice to get the blood sugar down a bit.  [id number]  .

## 2013-01-13 ENCOUNTER — Non-Acute Institutional Stay (SKILLED_NURSING_FACILITY): Payer: Medicare Other | Admitting: Internal Medicine

## 2013-01-13 DIAGNOSIS — E039 Hypothyroidism, unspecified: Secondary | ICD-10-CM

## 2013-01-13 DIAGNOSIS — I5032 Chronic diastolic (congestive) heart failure: Secondary | ICD-10-CM

## 2013-01-13 DIAGNOSIS — J8410 Pulmonary fibrosis, unspecified: Secondary | ICD-10-CM

## 2013-01-13 DIAGNOSIS — IMO0002 Reserved for concepts with insufficient information to code with codable children: Secondary | ICD-10-CM

## 2013-01-13 DIAGNOSIS — E1165 Type 2 diabetes mellitus with hyperglycemia: Secondary | ICD-10-CM | POA: Insufficient documentation

## 2013-01-13 DIAGNOSIS — D649 Anemia, unspecified: Secondary | ICD-10-CM

## 2013-01-13 DIAGNOSIS — J849 Interstitial pulmonary disease, unspecified: Secondary | ICD-10-CM

## 2013-01-13 DIAGNOSIS — N289 Disorder of kidney and ureter, unspecified: Secondary | ICD-10-CM

## 2013-01-13 NOTE — Progress Notes (Signed)
Patient ID: Christina Douglas, female   DOB: 1913/12/16, 79 y.o.   MRN: [id number] This is an -routine visit.  Facility [facility].  Level of care skilled .  Chief complaintmedical management of hypothyroidism CHF diabetes type 2 hypertension.   History of present illness.  Patient is a very pleasant 79 year old female with the above diagnoses The summer she did experience URI was treated with antibiotics and subsequently developed some diarrhea-however this appears to have resolved-she also had some increased renal insufficiency which apparently normalized closer to her baseline.  She also got a dose of Lasix for some suspected pulmonary vascular congestion this summer.-- this appears to be stable recently   .  She does have a previous listed history of diastolic CHF which has been quite stable during her stay here.   also is a type II diabetic basically has NovoLog insulin before lunch and dinner--this was increased slightly back in August secondary to elevated sugars later in the day t these actually appear improved.  A.m. blood sugars are largely the mid 100s-at noon in the higher 100s to low 200s--at 4 PM averaging in the mid 100s-in low to mid 100s at at bedtime   .    She does have a history of interstitial lung disease she continues on Advair as well as Spiriva.  Her blood pressures appear to be stable as well most recently 116/50-134/60-she had been on an ACE inhibitor that was discontinued secondary to renal issues.  She initially came here after sustaining a femoral neck fracture of her right hip she underwent an ORIF this appears to be stable.   Family medical social history has been reviewed per previous progress note 03/30/2011--as well as recent progress notes including   09/08/2002  she has been long-term resident this facility.   Medications have been reviewed per MAR.   Review of systems.  In general denies fever chill.  Skin-no complaints of itching or rashes .   Head ears eyes nose mouth and throat-does not complain of any sore throat visual changes or nasal discharge--recently  diagnosed with mild macular degeneration as well as diabetic retinopathy.  Respiratory-does not complaining of shortness of breath occasionally will have cough but this apparently is no greater than her baseline.  Heart denies any chest pain or palpitations.  GI-no complaints of nausea vomiting diarrhea constipation--apparently eats quite well  GU-no complaints of dysuria.  Musculoskeletal has general frailty but did not complain of any joint pain.  Neurologic-does not complain of any headache dizziness or syncopal-type feelings of.  Psych has no complaints she continues to be pleasant and engaged.   Physical exam.   Temperature 97.0 pulse 62 respirations 17 blood pressure 116/74-weight is stable at 138 -O2 saturation 98% on room air    In general this is a somewhat frail very pleasant elderly female in no distress lying comfortably in bed she is pleasant smiling laughing.  Skin is warm and dry   Head ears eyes nose mouth and throat-oropharynx clear dentition is poor mucous membranes are moist . pupils appear equal round reactive to light extraocular movements intact.--Visual acuity appears grossly intact  Chest--clear to auscultation bilaterally with shallow air entry-no labored breathing .  Her heart is regular rate and rhythm with a 2/6 systolic ejection murmur I do not note any elevated JVP she has trace lower extremity edema this is pretty much her baseline as well.  Abdomen soft nontender with active bowel sounds.  Muscle skeletal does move all her extremities  x4 has general frailty this appears to be at her baseline I did not note any deformities other than diffuse arthritic changes.  Neurologic is grossly intact no lateralizing findings her speech is clear.  Psych she is alert and oriented x1 pleasant and appropriate very engaging individual  .  Labs    09/08/2012.  WBC 7.5 hemoglobin 10.8 platelets 246.  Sodium 141 potassium 4.2 BUN 22 creatinine 1.16.  08/25/2012.  Vitamin D level  61I.4   08/19/2012.  WBC 9.5 hemoglobin 10.4 platelets 207.  Sodium 134 potassium 3.9 BUN 34 creatinine 1.35  .  Marland Kitchen  07/31/2012.  TSH-2.610.  Hemoglobin A1c 7.2.  04/25/2012.  WBC 5.9 hemoglobin 10.5 platelets 188.  Sodium 141 potassium 4.4 BUN 33 creatinine 1.09.   Liver function tests within normal limits   Assessment and plan.  #1-CHF-this has been stable for some time she does not appear to be symptomatic at this time her weights have been stable there has been some weight gain since earlier this year but I suspect this was appetite related .  #2-URI?  No evidence of this at bedside this evening she is on Advair as well as Spiriva history of chronic respiratory issues  #3-history of renal insufficiency--creatinines that have been quite variable for some time--we'll update this    #4-diabetes type 2-she is  on NovoLog 3 units at lunch and dinner--this was slightly increased this past August secondary some elevated sugars later in the day-this appears to have helped-we'll update a hemoglobin A1c--at one point it was thought aboutadding low-dose long-acting insulin-but at this point I don't feel is necessary it appears the slight increase in the short acting insulin has been beneficial--and would have concerns about . hypoglycemia with any additional long acting  insulin     .  #5-hypothyroidism this is stable she is on supplementation-we'll update TSH  #6-history right hip fracture likely osteoporosis- on vitamin D-----we'll add calcium-will have pharmacy recommend a combination vitamin D calcium  Anemia-this appears to be relatively at baseline I suspect this is of chronic disease. --Will update #8-possible history of interstitial lung disease status again she is on Advair as well as Spiriva.  #9 history of GERD-this appears to be  improving we have started her on Prilosec she does not complaining of GERD-like symptoms tonight. #10- macular degeneration diabetic retinopathy-she has been followed by ophthalmology in fact she just saw them yesterday and she has been started on topical eyedrops  [phone]

## 2013-02-01 ENCOUNTER — Non-Acute Institutional Stay (SKILLED_NURSING_FACILITY): Payer: Medicare Other | Admitting: Internal Medicine

## 2013-02-01 DIAGNOSIS — N289 Disorder of kidney and ureter, unspecified: Secondary | ICD-10-CM

## 2013-02-01 DIAGNOSIS — IMO0001 Reserved for inherently not codable concepts without codable children: Secondary | ICD-10-CM

## 2013-02-01 DIAGNOSIS — IMO0002 Reserved for concepts with insufficient information to code with codable children: Secondary | ICD-10-CM

## 2013-02-01 DIAGNOSIS — E1165 Type 2 diabetes mellitus with hyperglycemia: Secondary | ICD-10-CM

## 2013-02-01 DIAGNOSIS — E039 Hypothyroidism, unspecified: Secondary | ICD-10-CM

## 2013-02-01 NOTE — Progress Notes (Signed)
Patient ID: Christina Douglas, female   DOB: [birth year], 80 y.o.   MRN: [id number]   Acute visit.  Level care skilled.  Facility [facility].  Chief complaint-acute visit secondary to elevated TSH.--Followup CBGs-renal insufficiency  Patient is a pleasant elderly resident who was seen recently for routine visit.  Labs were ordered which came back fairly unremarkable except for an elevated TSH of 5.34  Creatinine is  1.13---and this appears to be relatively her baseline at times it has gone up to 1.4 area-apparently she is eating and drinking pretty consistently now.  We also been monitoring her blood sugars secondary to some elevated readings in the past-she is on Humalog 3 units subcutaneously at lunch and dinner blood sugars appear to hover mainly in the mid 100s with a recent hemoglobin of A1c of 7.0 this appears to be satisfactory.  She is on Synthroid 75 mcg a day-clinically she continues to be stable no complaints of palpitations or chest pain blood pressure is stable as well as pulse in the 60s and 70s.  Family medical social history as been reviewed.  Medications reviewed per MAR.  Review of systems.  In general no complaints fevers chills.  Respiratory no complaint shortness of breath or cough.  Cardiac no chest pain.  GI no abdominal pain nausea or vomiting.  Neurologic no headache dizziness.Marland Kitchen.  Physical exam.  Temperature is 98.0 pulse 72 respirations 19 blood pressure 122/78 weight is 138.  In general this is a pleasant somewhat frail elderly female in no distress lying in bed.  Skin is warm and dry.  Oropharynx clear mucous membranes moist.  Chest is clear to auscultation with somewhat shallow air entry.  Heart is regular rate and rhythm  Extremities-general frailty but moves all extremities at baseline she ambulates well and her wheelchair which is her baseline.  Labs.  01/15/2013.  Sodium 141 potassium 4.5 BUN 26 creatinine 1.13.  Liver function  tests within normal limits albumin is 4.1.  Hemoglobin A1c 7.0.  TSH-5.34.  Assessment and plan.  #1-hypothyroidism-will increase her Synthroid slightly to 88 mcg a day and recheck this in 4 weeks.  #2-renal insufficiency-she appears to be back at her baseline continue to encourage fluids.  #3-diabetes type 2-again we're being somewhat conservative secondary to her advanced age hemoglobin A1c satisfactory blood sugars appear to be fairly satisfactory as well.  [id number]

## 2013-02-19 IMAGING — CR DG CHEST 1V
1 series · 1 of 1 positions shown · non-contrast
Comparison: 12/14/2006

CLINICAL DATA: Fall.  Confusion.

CHEST - 1 VIEW

[t chest supine]
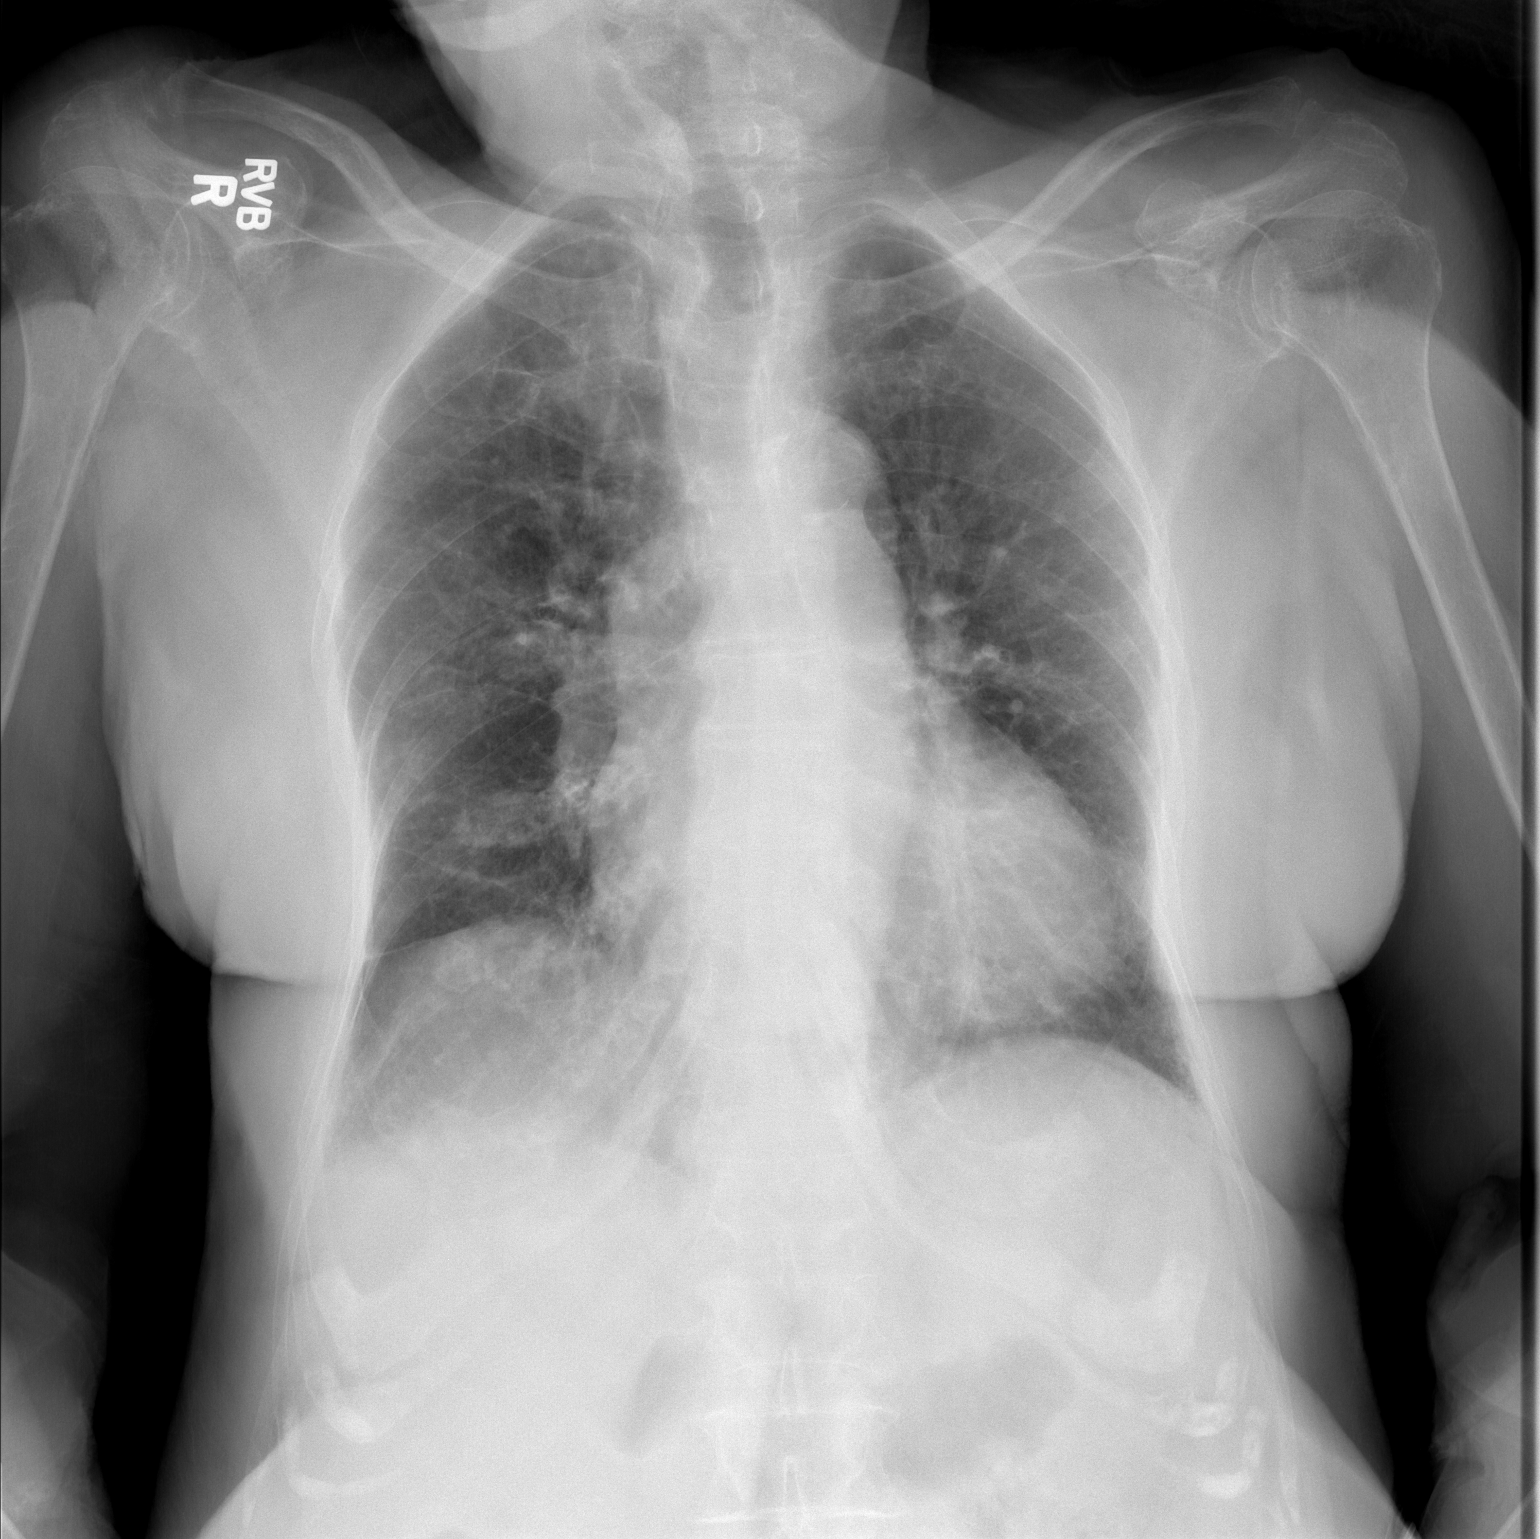

[1 of 1 positions shown; findings below may reference images not displayed]

FINDINGS: Cardiomegaly is present.  Accentuated pulmonary
interstitium noted, although less striking than on the prior exam
in the lung bases.  There is likely pulmonary venous hypertension
given the cephalization of blood flow.

Mild tortuosity of the thoracic aorta is present.

Thoracic spondylosis noted.  Aortic arch atherosclerosis is
present.
IMPRESSION: 1.  Cardiomegaly with pulmonary venous hypertension.
2.  Chronic underlying interstitial prominence, although this is
improved in the lung bases compared the prior exam.

## 2013-06-20 ENCOUNTER — Non-Acute Institutional Stay (SKILLED_NURSING_FACILITY): Payer: Medicare Other | Admitting: Internal Medicine

## 2013-06-20 DIAGNOSIS — E1165 Type 2 diabetes mellitus with hyperglycemia: Secondary | ICD-10-CM

## 2013-06-20 DIAGNOSIS — IMO0002 Reserved for concepts with insufficient information to code with codable children: Secondary | ICD-10-CM

## 2013-06-20 DIAGNOSIS — IMO0001 Reserved for inherently not codable concepts without codable children: Secondary | ICD-10-CM

## 2013-06-20 DIAGNOSIS — E039 Hypothyroidism, unspecified: Secondary | ICD-10-CM

## 2013-06-20 DIAGNOSIS — I5032 Chronic diastolic (congestive) heart failure: Secondary | ICD-10-CM

## 2013-06-27 ENCOUNTER — Non-Acute Institutional Stay (SKILLED_NURSING_FACILITY): Payer: Medicare Other | Admitting: Internal Medicine

## 2013-06-27 DIAGNOSIS — IMO0001 Reserved for inherently not codable concepts without codable children: Secondary | ICD-10-CM

## 2013-06-27 DIAGNOSIS — E1165 Type 2 diabetes mellitus with hyperglycemia: Secondary | ICD-10-CM

## 2013-06-27 DIAGNOSIS — IMO0002 Reserved for concepts with insufficient information to code with codable children: Secondary | ICD-10-CM

## 2013-06-27 NOTE — Progress Notes (Signed)
Patient ID: Christina Douglas, female   DOB: [birth year], 80 y.o.   MRN: [id number]                   PROGRESS NOTE  DATE:  06/20/2013    [facility]    LEVEL OF CARE:   SNF   Routine Visit   CHIEF COMPLAINT:  Routine visit to follow diabetes and other medical issues, including hypothyroidism.     HISTORY OF PRESENT ILLNESS:  Christina Douglas is a frail woman who appears to have been in this building since at least 2012.  At that time, she had an impacted femoral neck fracture and was discharged here for rehabilitation.  I do not believe she ever graduated to home and she became an ongoing patient.    PAST MEDICAL HISTORY/PROBLEM LIST:    Diastolically-mediated heart failure.     Bronchitis.  ?Chronic bronchitis.    Hypothyroidism.  On replacement.     Hypertension.    Type 2 diabetes.    Renal insufficiency.    History of anemia of chronic disease.     CURRENT MEDICATIONS:    Medication list is reviewed.     Vitamin D3, 2000 U daily.   Prilosec 20 q.d.   Spiriva 1 capsule daily.   Synthroid 88 mcg daily.   Advair 250/50, 1 puff twice daily.   Oyster shell plus D 500, 1 tablet twice daily.   NovoLog 3 U a.c. lunch and dinner.  She remains on insulin checks four times a day, mostly varying between 100 and 200.  I see no good reason why this should continue like this.  If anything, she could probably do with a small dose of an oral agent.    REVIEW OF SYSTEMS:  I do not know how really accurate this is.  However,  CHEST/RESPIRATORY:  She does not complain of cough or shortness of breath.   CARDIAC:   No clear chest pain.    PHYSICAL EXAMINATION:   VITAL SIGNS:   O2 SATURATIONS:  94% on room air.   PULSE:  92.   RESPIRATIONS:  18 and unlabored.   GENERAL APPEARANCE:  This is a very frail woman in no distress.   CHEST/RESPIRATORY:  Very poor air entry bilaterally with upper airway sounds in the left lung.  However, she is not struggling to breathe.     CARDIOVASCULAR:  CARDIAC:   Heart sounds are normal.  She does not appear to be dehydrated.    ASSESSMENT/PLAN:  Diastolic heart failure.  I do not see any evidence of this.  She is not on a diuretic.    COPD.  Again, this appears to be adequately controlled.    Renal insufficiency secondary to diabetes.  Her BUN was 26, creatinine 1.13 on 01/19/2013, giving her an estimated GFR of 41.  Hemoglobin A1c at the time was 7.    Hypothyroidism.  On replacement.   I am concerned about this.  On 01/19/2013, her TSH was 7.34.  This was rechecked on 03/01/2013 at 0.593.  She may actually be getting too much Synthroid.  I will recheck this.  An order on January 1st asked for a TSH in four weeks.  I do not actually see that this was done.    Type 2 diabetes.  Her current regimen does not make any sense to me.  I do not think this lady needs NovoLog twice a day and I do not really believe she needs to  have her CBGs checked four times a day.  I am going to put her on a small dose of an oral agent.    CPT CODE: 56812

## 2013-07-02 NOTE — Progress Notes (Addendum)
Patient ID: Christina Douglas, female   DOB: 1913/10/05, 80 y.o.   MRN: [id number]                  PROGRESS NOTE  DATE:  06/27/2013    FAC[facility]: [facility]    LEVEL OF CARE:   SNF   Acute Visit   CHIEF COMPLAINT:  Follow up diabetes, coughing.    HISTORY OF PRESENT ILLNESS:  I saw Mrs. Hyche routinely last week.  She was on a very unusual regimen of NovoLog insulin 3 U a.c. lunch and dinner with blood sugars varying mostly between 100 and 200.  I did order a hemoglobin A1c on her, although I do not see those results.  I put her on 5 mg of oral glyburide.  Apparently, her blood sugars have dramatically improved and are somewhat low in the morning.    She also has been unwell with increasing congestion and lethargy.    PHYSICAL EXAMINATION:   VITAL SIGNS:   TEMPERATURE:  98.9.   PULSE:  70.   RESPIRATIONS:  18.   BLOOD PRESSURE:  118/72.   O2 SATURATIONS:  94% on room air.   CHEST/RESPIRATORY:  Clear air entry bilaterally.    CARDIOVASCULAR:  CARDIAC:   Heart sounds are normal.  No increase in jugular venous pressure.  There is no evidence of CHF.    ASSESSMENT/PLAN:  Type 2 diabetes.  Now on oral agents.  I am going to reduce the glyburide to 2.5 mg.   I had some thoughts of keeping her off this.  However, looking back on her blood sugars from last week shows blood sugars in the high 100s, 200s.  I will see the hemoglobin A1c before I consider this further.    Cough.  I see no good reason to give her antibiotics.  This is probably a viral issue and/or aspiration.

## 2013-07-04 ENCOUNTER — Non-Acute Institutional Stay (SKILLED_NURSING_FACILITY): Payer: Medicare Other | Admitting: Internal Medicine

## 2013-07-04 DIAGNOSIS — E039 Hypothyroidism, unspecified: Secondary | ICD-10-CM

## 2013-07-04 DIAGNOSIS — J189 Pneumonia, unspecified organism: Secondary | ICD-10-CM

## 2013-07-04 NOTE — Progress Notes (Signed)
Patient ID: Christina Douglas, female   DOB: 04/02/1913, 80 y.o.   MRN: [id number] Facility; [facility]  Chief complaint cough elevated white count. History; I saw Christina Douglas who is a very frail elderly woman last week for diabetes issues. I noted she had a cough however her O2 sats were stable her exam seemed normal. Subsequently the cough has continued to have chest x-ray showed a small left basilar infiltrate and she was given 5 days of Levaquin. Lab work came back showing a white count of 16.8 with an essential normal differential. Lab work including a basic metabolic panel showed a potassium of 3.4 her hemoglobin A1c was 7.1 and her TSH is 15.38.  I am seeing her today in followup of she is still has a loose cough. Beyond that she doesn't seem to be in any distress she is afebrile.  Current medications Vitamin D3 2000 units daily Prilosec 20 mg daily Spiriva 1 capsule daily Synthroid 88 daily Advair 250/50 one puff twice daily Os-Cal plus D5 100 twice daily Glyburide 2.5 daily Duo nebs one inhaler every 6 when necessary  Physical examination Gen. patient is lying in bed very frail looking but awake and conversational Respiratory shallow air entry bilaterally no crackles or wheezes there are upper airway sounds she does not appear to be in respiratory distress Cardiac heart sounds are normal does not appear to be any murmurs if anything as she appears slightly volume contracted. Abdomen somewhat tender in the left mid abdomen there is a palpable abdominal aorta. Extremities no evidence of cellulitis.  Impression/plan #1 loose cough in the setting of COPD. I'm going to give her Mucinex. Whether she really had left lower lobe pneumonia or not I am unsure although the Levaquin does not help the cough. #2 elevated white count I think this probably should be repeated #3 hypothyroidism I will increase her Synthroid had repeat her TSH in 6-8 weeks #4 hypokalemia; I see really no cause of  this I am simply going to replace this #5 abdominal tenderness I wonder about an underlying abdominal aortic aneurysm but I am not going to press this issue she has no advanced directives.

## 2013-09-19 ENCOUNTER — Non-Acute Institutional Stay (SKILLED_NURSING_FACILITY): Payer: Medicare Other | Admitting: Internal Medicine

## 2013-09-19 DIAGNOSIS — E118 Type 2 diabetes mellitus with unspecified complications: Principal | ICD-10-CM

## 2013-09-19 DIAGNOSIS — E1165 Type 2 diabetes mellitus with hyperglycemia: Secondary | ICD-10-CM

## 2013-09-19 DIAGNOSIS — J449 Chronic obstructive pulmonary disease, unspecified: Secondary | ICD-10-CM

## 2013-09-19 DIAGNOSIS — IMO0002 Reserved for concepts with insufficient information to code with codable children: Secondary | ICD-10-CM

## 2013-09-24 NOTE — Progress Notes (Signed)
Patient ID: Christina Douglas, female   DOB: 09-19-1913, 80 y.o.   MRN: [id number]              PROGRESS NOTE  DATE:  09/19/2013    FAC[facility]: [facility]    LEVEL OF CARE:   SNF   Routine Visit   CHIEF COMPLAINT:  Follow up medical issues, including diabetes.    HISTORY OF PRESENT ILLNESS:  Christina Douglas is a very pleasant, elderly woman who has been in the facility, I believe, since 2012.    She was last hospitalized in 2013, at which time she had healthcare-acquired pneumonia.    She is a known type 2 diabetic and was on a very odd insulin regimen earlier this year.  I switched her to oral Glipizide, currently at 2.5 mg daily, and she really appears to be doing fairly well on this with blood sugars mostly in the lower to mid 100s.  We were doing this twice a day.  I think this is unnecessary.  We can probably reduce this.    She has not had any recent lab work.  She needs a hemoglobin A1c and probably a TSH, as well.    Her other medical issues include COPD and she has been on Spiriva as well as Symbicort 160/4.5.  There does not appear to be any major issues here.    PHYSICAL EXAMINATION:   GENERAL APPEARANCE:  The patient is lying in bed.  Awake, conversational, and pleasant.   CHEST/RESPIRATORY:  Shallow air entry bilaterally, but no crackles or wheezes.   CARDIOVASCULAR:  CARDIAC:   Heart sounds are normal.  There are no murmurs.  She appears to be euvolemic.   GASTROINTESTINAL:  ABDOMEN:   Somewhat tender in the left mid abdomen with a palpable abdominal aorta, but no clear evidence of an aneurysm.   CIRCULATION:  EDEMA/VARICOSITIES:   Extremities:  No edema.    ASSESSMENT/PLAN:  COPD.  This is stable.  She is on Symbicort, Spiriva, and Mucinex.  She appears to be stable.    Type 2 diabetes.  I think this is very stable.  I will check a hemoglobin A1c on her.  Certainly, CBGs b.i.d. seem to be superfluous.  I almost wonder whether she even needs oral treatment.  However,  I will wait until after the hemoglobin A1c.     Hypothyroidism.  On replacement.   Currently, Synthroid 100.  I am going to check a TSH on her.    Overall, the patient appears to be stable in this regard.  No additional changes made.  I will await lab work to reduce her CBGs.    CPT CODE: 44010

## 2013-09-26 ENCOUNTER — Non-Acute Institutional Stay (SKILLED_NURSING_FACILITY): Payer: Medicare Other | Admitting: Internal Medicine

## 2013-09-26 DIAGNOSIS — E1165 Type 2 diabetes mellitus with hyperglycemia: Secondary | ICD-10-CM

## 2013-09-26 DIAGNOSIS — E118 Type 2 diabetes mellitus with unspecified complications: Principal | ICD-10-CM

## 2013-09-26 DIAGNOSIS — E038 Other specified hypothyroidism: Secondary | ICD-10-CM

## 2013-09-26 DIAGNOSIS — IMO0002 Reserved for concepts with insufficient information to code with codable children: Secondary | ICD-10-CM

## 2013-10-03 NOTE — Progress Notes (Addendum)
Patient ID: Christina Douglas, female   DOB: 1913-03-02, 80 y.o.   MRN: [id number]               PROGRESS NOTE  DATE:  09/26/2013        FAC[facility]: [facility]    LEVEL OF CARE:   SNF   Acute Visit   CHIEF COMPLAINT:  Follow up diabetes and hypothyroidism.    HISTORY OF PRESENT ILLNESS:  This is a patient whom I saw in routine visitation last week.  She is a known type 2 diabetic and was on a very odd insulin regimen this year.  I switched her to oral glipizide at 2.5 daily.  Sugars are mostly in the mid to lower 100s.  Follow-up lab work I did showed a hemoglobin A1c of 6.1.  I think the patient can probably be discontinued off her oral hyperglycemic agent as 6.1 is too tight of control for a very elderly woman, even though she is diabetic.    With regards to her TSH, her TSH has come back at 41.41.   Apparently on 09/07/2013, this was 68.03.  Her order for 125 mcg was on 09/08/2013.  I do not think this needs to be rechecked again.      PHYSICAL EXAMINATION:   GENERAL APPEARANCE:  The patient is pleasant, awake, and conversational.   CHEST/RESPIRATORY:  Shallow, but otherwise clear air entry.   CARDIOVASCULAR:  CARDIAC:   Heart sounds are normal.  There are no murmurs.    ASSESSMENT/PLAN:  Type 2 diabetes.  I think the glipizide can be discontinued at this point.  I will follow up on her CBGs, perhaps her hemoglobin A1c in a month or two.    Hypothyroidism.  Currently on 125 mcg (my last note suggesting 100 may have been an error).

## 2013-12-14 ENCOUNTER — Emergency Department (HOSPITAL_COMMUNITY): Payer: Medicare Other

## 2013-12-14 ENCOUNTER — Encounter (HOSPITAL_COMMUNITY): Payer: Self-pay | Admitting: Emergency Medicine

## 2013-12-14 ENCOUNTER — Inpatient Hospital Stay (HOSPITAL_COMMUNITY)
Admission: EM | Admit: 2013-12-14 | Discharge: 2013-12-19 | DRG: 640 | Disposition: A | Discharge status: Skilled Nursing Facility | Payer: Medicare Other | Attending: Internal Medicine | Admitting: Internal Medicine

## 2013-12-14 DIAGNOSIS — D638 Anemia in other chronic diseases classified elsewhere: Secondary | ICD-10-CM | POA: Diagnosis present

## 2013-12-14 DIAGNOSIS — E039 Hypothyroidism, unspecified: Secondary | ICD-10-CM | POA: Diagnosis present

## 2013-12-14 DIAGNOSIS — Z96649 Presence of unspecified artificial hip joint: Secondary | ICD-10-CM | POA: Diagnosis present

## 2013-12-14 DIAGNOSIS — Z66 Do not resuscitate: Secondary | ICD-10-CM | POA: Diagnosis present

## 2013-12-14 DIAGNOSIS — E870 Hyperosmolality and hypernatremia: Principal | ICD-10-CM | POA: Diagnosis present

## 2013-12-14 DIAGNOSIS — Z794 Long term (current) use of insulin: Secondary | ICD-10-CM

## 2013-12-14 DIAGNOSIS — E785 Hyperlipidemia, unspecified: Secondary | ICD-10-CM | POA: Diagnosis present

## 2013-12-14 DIAGNOSIS — R05 Cough: Secondary | ICD-10-CM

## 2013-12-14 DIAGNOSIS — R1084 Generalized abdominal pain: Secondary | ICD-10-CM

## 2013-12-14 DIAGNOSIS — F0280 Dementia in other diseases classified elsewhere without behavioral disturbance: Secondary | ICD-10-CM | POA: Diagnosis present

## 2013-12-14 DIAGNOSIS — Z79899 Other long term (current) drug therapy: Secondary | ICD-10-CM | POA: Diagnosis not present

## 2013-12-14 DIAGNOSIS — R109 Unspecified abdominal pain: Secondary | ICD-10-CM

## 2013-12-14 DIAGNOSIS — J189 Pneumonia, unspecified organism: Secondary | ICD-10-CM | POA: Diagnosis present

## 2013-12-14 DIAGNOSIS — G309 Alzheimer's disease, unspecified: Secondary | ICD-10-CM | POA: Diagnosis present

## 2013-12-14 DIAGNOSIS — E1165 Type 2 diabetes mellitus with hyperglycemia: Secondary | ICD-10-CM | POA: Diagnosis present

## 2013-12-14 DIAGNOSIS — Z7951 Long term (current) use of inhaled steroids: Secondary | ICD-10-CM | POA: Diagnosis not present

## 2013-12-14 DIAGNOSIS — Y95 Nosocomial condition: Secondary | ICD-10-CM | POA: Diagnosis present

## 2013-12-14 DIAGNOSIS — I5032 Chronic diastolic (congestive) heart failure: Secondary | ICD-10-CM

## 2013-12-14 DIAGNOSIS — B958 Unspecified staphylococcus as the cause of diseases classified elsewhere: Secondary | ICD-10-CM | POA: Diagnosis present

## 2013-12-14 DIAGNOSIS — Z23 Encounter for immunization: Secondary | ICD-10-CM

## 2013-12-14 DIAGNOSIS — E876 Hypokalemia: Secondary | ICD-10-CM | POA: Diagnosis present

## 2013-12-14 DIAGNOSIS — R059 Cough, unspecified: Secondary | ICD-10-CM

## 2013-12-14 DIAGNOSIS — I10 Essential (primary) hypertension: Secondary | ICD-10-CM | POA: Diagnosis present

## 2013-12-14 DIAGNOSIS — IMO0001 Reserved for inherently not codable concepts without codable children: Secondary | ICD-10-CM | POA: Diagnosis present

## 2013-12-14 DIAGNOSIS — R7881 Bacteremia: Secondary | ICD-10-CM | POA: Diagnosis not present

## 2013-12-14 DIAGNOSIS — E860 Dehydration: Secondary | ICD-10-CM | POA: Insufficient documentation

## 2013-12-14 DIAGNOSIS — R739 Hyperglycemia, unspecified: Secondary | ICD-10-CM

## 2013-12-14 DIAGNOSIS — D649 Anemia, unspecified: Secondary | ICD-10-CM | POA: Diagnosis present

## 2013-12-14 DIAGNOSIS — IMO0002 Reserved for concepts with insufficient information to code with codable children: Secondary | ICD-10-CM | POA: Diagnosis present

## 2013-12-14 LAB — BASIC METABOLIC PANEL
Anion gap: 23 — ABNORMAL HIGH (ref 5–15)
BUN: 59 mg/dL — ABNORMAL HIGH (ref 6–23)
CO2: 20 mEq/L (ref 19–32)
Calcium: 10.4 mg/dL (ref 8.4–10.5)
Chloride: 108 mEq/L (ref 96–112)
Creatinine, Ser: 1.03 mg/dL (ref 0.50–1.10)
GFR calc Af Amer: 51 mL/min — ABNORMAL LOW (ref >90)
GFR calc non Af Amer: 44 mL/min — ABNORMAL LOW (ref >90)
Glucose, Bld: 493 mg/dL — ABNORMAL HIGH (ref 70–99)
Potassium: 3.8 mEq/L (ref 3.7–5.3)
Sodium: 151 mEq/L — ABNORMAL HIGH (ref 137–147)

## 2013-12-14 LAB — CBG MONITORING, ED
Glucose-Capillary: 441 mg/dL — ABNORMAL HIGH (ref 70–99)
Glucose-Capillary: 419 mg/dL — ABNORMAL HIGH (ref 70–99)
Glucose-Capillary: 414 mg/dL — ABNORMAL HIGH (ref 70–99)

## 2013-12-14 LAB — CBC WITH DIFFERENTIAL/PLATELET
Band Neutrophils: 0 % (ref 0–10)
Basophils Absolute: 0 10*3/uL (ref 0.0–0.1)
Basophils Relative: 0 % (ref 0–1)
Blasts: 0 %
Eosinophils Absolute: 0 10*3/uL (ref 0.0–0.7)
Eosinophils Relative: 0 % (ref 0–5)
HCT: 38.5 % (ref 36.0–46.0)
Hemoglobin: 12.4 g/dL (ref 12.0–15.0)
Lymphocytes Relative: 8 % — ABNORMAL LOW (ref 12–46)
Lymphs Abs: 1.9 10*3/uL (ref 0.7–4.0)
MCH: 32.5 pg (ref 26.0–34.0)
MCHC: 32.2 g/dL (ref 30.0–36.0)
MCV: 100.8 fL — ABNORMAL HIGH (ref 78.0–100.0)
Metamyelocytes Relative: 0 %
Monocytes Absolute: 0.2 10*3/uL (ref 0.1–1.0)
Monocytes Relative: 1 % — ABNORMAL LOW (ref 3–12)
Myelocytes: 0 %
Neutro Abs: 21.7 10*3/uL — ABNORMAL HIGH (ref 1.7–7.7)
Neutrophils Relative %: 91 % — ABNORMAL HIGH (ref 43–77)
Platelets: 201 10*3/uL (ref 150–400)
Promyelocytes Absolute: 0 %
RBC: 3.82 MIL/uL — ABNORMAL LOW (ref 3.87–5.11)
RDW: 13.4 % (ref 11.5–15.5)
WBC: 23.8 10*3/uL — ABNORMAL HIGH (ref 4.0–10.5)
nRBC: 0 /100 WBC

## 2013-12-14 LAB — BLOOD GAS, ARTERIAL
Acid-base deficit: 2.5 mmol/L — ABNORMAL HIGH (ref 0.0–2.0)
Bicarbonate: 21.2 mEq/L (ref 20.0–24.0)
Drawn by: 22223
FIO2: 21 %
O2 Saturation: 95.2 %
Patient temperature: 37
TCO2: 19.2 mmol/L (ref 0–100)
pCO2 arterial: 33.1 mmHg — ABNORMAL LOW (ref 35.0–45.0)
pH, Arterial: 7.423 (ref 7.350–7.450)
pO2, Arterial: 77.2 mmHg — ABNORMAL LOW (ref 80.0–100.0)

## 2013-12-14 LAB — URINALYSIS, ROUTINE W REFLEX MICROSCOPIC
Glucose, UA: 500 mg/dL — AB
Ketones, ur: 15 mg/dL — AB
Leukocytes, UA: NEGATIVE
Nitrite: NEGATIVE
Protein, ur: 100 mg/dL — AB
Specific Gravity, Urine: 1.025 (ref 1.005–1.030)
Urobilinogen, UA: 0.2 mg/dL (ref 0.0–1.0)
pH: 5.5 (ref 5.0–8.0)

## 2013-12-14 LAB — URINE MICROSCOPIC-ADD ON

## 2013-12-14 LAB — TROPONIN I: Troponin I: <0.3 ng/mL (ref <0.30)

## 2013-12-14 MED ORDER — SODIUM CHLORIDE 0.9 % IV BOLUS (SEPSIS)
500.0000 mL | Freq: Once | INTRAVENOUS | Status: AC
  Administered 2013-12-14: 500 mL via INTRAVENOUS

## 2013-12-14 MED ORDER — CEFEPIME HCL 1 G IJ SOLR: 1.0000 g | Freq: Once | INTRAMUSCULAR | Status: DC

## 2013-12-14 MED ORDER — INSULIN ASPART 100 UNIT/ML ~~LOC~~ SOLN
10.0000 [IU] | Freq: Once | SUBCUTANEOUS | Status: AC
Start: 2013-12-14 — End: 2013-12-15
  Administered 2013-12-15: 10 [IU] via SUBCUTANEOUS

## 2013-12-14 MED ORDER — SODIUM CHLORIDE 0.9 % IV SOLN: INTRAVENOUS | Status: DC

## 2013-12-14 MED ORDER — SODIUM CHLORIDE 0.9 % IV SOLN
INTRAVENOUS | Status: DC
  Administered 2013-12-14: 3.6 [IU]/h via INTRAVENOUS
  Filled 2013-12-14: qty 2.5

## 2013-12-14 MED ORDER — VANCOMYCIN HCL IN DEXTROSE 1-5 GM/200ML-% IV SOLN
1000.0000 mg | Freq: Once | INTRAVENOUS | Status: AC
  Administered 2013-12-14: 1000 mg via INTRAVENOUS
  Filled 2013-12-14: qty 200

## 2013-12-14 NOTE — ED Notes (Signed)
Dr. Kohut at bedside 

## 2013-12-14 NOTE — ED Notes (Signed)
[facility]. Reports cough for several days. Pt. Mental status appropriate at baseline. EMS reports blood sugar 485.

## 2013-12-14 NOTE — H&P (Signed)
PCP:   Terald SleeperOBSON,MICHAEL GAVIN, MD   Chief Complaint:  cough  HPI: 80 yo female h/o diastolic chf, htn, adv dementia, dm sent in by snf for cough.  No fever.  Has not been eating well.  Pt cannot provide much history due to her dementia.  [facility] unable to contact.  ED call snf, she is listed full code.    Review of Systems:  Unobtainable from patient  Past Medical History: Past Medical History  Diagnosis Date  . Diabetes mellitus   . Muscle weakness (generalized)   . Diastolic heart failure   . Hypertension   . Thyroid disease   . Kidney injury   . Dementia   . CHF (congestive heart failure)   . Anemia   . Hypothyroid   . Interstitial lung disorders   . Hyperlipidemia    Past Surgical History  Procedure Laterality Date  . Cataract extraction    . Tonsillectomy    . Total hip arthroplasty      Medications: Prior to Admission medications   Medication Sig Start Date End Date Taking? Authorizing Provider  budesonide-formoterol (SYMBICORT) 160-4.5 MCG/ACT inhaler Inhale 2 puffs into the lungs 2 (two) times daily.   Yes Historical Provider, MD  Cholecalciferol 2000 UNITS TABS Take 1 tablet by mouth daily.   Yes Historical Provider, MD  guaiFENesin (MUCINEX) 600 MG 12 hr tablet Take 600 mg by mouth 2 (two) times daily.   Yes Historical Provider, MD  insulin aspart (NOVOLOG) 100 UNIT/ML injection Inject 10 Units into the skin stat.    Yes Historical Provider, MD  ipratropium-albuterol (DUONEB) 0.5-2.5 (3) MG/3ML SOLN Take 3 mLs by nebulization every 6 (six) hours as needed. Shortness of breath   Yes Historical Provider, MD  levothyroxine (SYNTHROID, LEVOTHROID) 125 MCG tablet Take 125 mcg by mouth daily before breakfast.   Yes Historical Provider, MD  Multiple Vitamin (MULITIVITAMIN WITH MINERALS) TABS Take 1 tablet by mouth daily.   Yes Historical Provider, MD  omeprazole (PRILOSEC) 20 MG capsule Take 20 mg by mouth daily.   Yes Historical Provider, MD  Ethelda Chickyster Shell (OYSTER  CALCIUM) 500 MG TABS tablet Take 500 mg of elemental calcium by mouth 2 (two) times daily.   Yes Historical Provider, MD  tiotropium (SPIRIVA) 18 MCG inhalation capsule Place 18 mcg into inhaler and inhale daily.   Yes Historical Provider, MD  acetaminophen (TYLENOL) 325 MG tablet Take 650 mg by mouth every 4 (four) hours as needed. Pain/fever    Historical Provider, MD  acidophilus (RISAQUAD) CAPS Take 1 capsule by mouth 2 (two) times daily. 14 day course, should be completed by 05/17/11    Historical Provider, MD  Fluticasone-Salmeterol (ADVAIR) 100-50 MCG/DOSE AEPB Inhale 1 puff into the lungs every 12 (twelve) hours.    Historical Provider, MD  HYDROcodone-acetaminophen (NORCO) 5-325 MG per tablet Take 1 tablet by mouth 2 (two) times daily as needed. Moderate to severe pain    Historical Provider, MD  levothyroxine (SYNTHROID, LEVOTHROID) 75 MCG tablet Take 125 mcg by mouth daily.     Historical Provider, MD  lisinopril (PRINIVIL,ZESTRIL) 10 MG tablet Take 10 mg by mouth daily. Hold for systolic BP <105    Historical Provider, MD  polyethylene glycol (MIRALAX / GLYCOLAX) packet Take 17 g by mouth daily.    Historical Provider, MD    Allergies:   Allergies  Allergen Reactions  . Penicillins     Social History:  reports that she has never smoked. She has never used smokeless  tobacco. She reports that she does not drink alcohol or use illicit drugs.  Family History: unknown  Physical Exam: Filed Vitals:   12/14/13 1919  BP: 156/59  Pulse: 79  Temp: 98.8 F (37.1 C)  TempSrc: Rectal  Resp: 23  SpO2: 93%   General appearance: alert, cachectic and no distress Head: Normocephalic, without obvious abnormality, atraumatic Eyes: negative Nose: Nares normal. Septum midline. Mucosa normal. No drainage or sinus tenderness. Neck: no JVD and supple, symmetrical, trachea midline Lungs: clear to auscultation bilaterally Heart: regular rate and rhythm, S1, S2 normal, no murmur, click, rub  or gallop Abdomen: soft, non-tender; bowel sounds normal; no masses,  no organomegaly Extremities: extremities normal, atraumatic, no cyanosis or edema Pulses: 2+ and symmetric Skin: Skin color, texture, turgor normal. No rashes or lesions Neurologic: Grossly normal  Labs on Admission:   Recent Labs  12/14/13 1933  NA 151*  K 3.8  CL 108  CO2 20  GLUCOSE 493*  BUN 59*  CREATININE 1.03  CALCIUM 10.4    Recent Labs  12/14/13 1933  WBC 23.8*  NEUTROABS 21.7*  HGB 12.4  HCT 38.5  MCV 100.8*  PLT 201    Recent Labs  12/14/13 1933  TROPONINI <0.30   Radiological Exams on Admission: Dg Chest Portable 1 View  12/14/2013   CLINICAL DATA:  Cough for 2 days initial evaluation  EXAM: PORTABLE CHEST - 1 VIEW  COMPARISON:  05/14/2011  FINDINGS: Heart size normal. Mild central vascular congestion. Moderate diffuse interstitial prominence similar to prior study. No consolidation or effusion. Aortic arch calcifications stable.  IMPRESSION: Similar to prior study there is increased interstitial opacity bilaterally, slightly worse. In the setting of accompanying mild vascular congestion the findings are concerning for interstitial pulmonary edema.   Electronically Signed   By: Esperanza Heiraymond  Rubner M.D.   On: 12/14/2013 20:18    Assessment/Plan  [id number] female advanced dementia sent in from snf for cough found to have dehydration with hypernatremia  Principal Problem:   Dehydration with hypernatremia -ivf.  No source of infection will hold off on any further abx unless leukocytosis does not improve or spikes a temp.  Ck lactic acid level.  Active Problems: stable unless o/w noted   Cough   DM (diabetes mellitus), type 2, uncontrolled-  Place on ssi.   Chronic diastolic heart failure-  compensated   Chronic anemia  Need goals of care discussion with family.  Full code.   Brantley Naser A 12/14/2013, 10:14 PM

## 2013-12-14 NOTE — ED Provider Notes (Signed)
CSN: [id number]     Arrival date & time 12/14/13  1903 History   First MD Initiated Contact with Patient 12/14/13 1912     Chief Complaint  Patient presents with  . Cough     (Consider location/radiation/quality/duration/timing/severity/associated sxs/prior Treatment) HPI   80 [facility]?. Patient has a history of dementia. Nonverbal. Apparently she is close to her baseline though? Unfortunately paperwork sent with her provides little information with regards to her current presentation. EMS reports a blood sugar 485. She was given 10 units of NovoLog just prior to transfer to the emergency room. Her blood sugar remains elevated in the 400s. She does have a history of diabetes. Per brief review of records,she was previously on glipizide that was decreased and then stopped secondary to very good glucose control and her advanced age. Note from 10/2013 mentions hemoglobin A1c of 6.1. Unclear how long her blood sugars may have been elevated for recently.  No trauma reported.   Past Medical History  Diagnosis Date  . Diabetes mellitus   . Muscle weakness (generalized)   . Diastolic heart failure   . Hypertension   . Thyroid disease   . Kidney injury   . Dementia   . CHF (congestive heart failure)   . Anemia   . Hypothyroid   . Interstitial lung disorders   . Hyperlipidemia    Past Surgical History  Procedure Laterality Date  . Cataract extraction    . Tonsillectomy    . Total hip arthroplasty     No family history on file. History  Substance Use Topics  . Smoking status: Never Smoker   . Smokeless tobacco: Never Used  . Alcohol Use: No   OB History    No data available     Review of Systems  Level V caveat because patient is nonverbal.  Allergies  Penicillins  Home Medications   Prior to Admission medications   Medication Sig Start Date End Date Taking? Authorizing Provider  acetaminophen (TYLENOL) 325 MG tablet  Take 650 mg by mouth every 4 (four) hours as needed. Pain/fever    Historical Provider, MD  acidophilus (RISAQUAD) CAPS Take 1 capsule by mouth 2 (two) times daily. 14 day course, should be completed by 05/17/11    Historical Provider, MD  Cholecalciferol 2000 UNITS TABS Take 1 tablet by mouth daily.    Historical Provider, MD  Fluticasone-Salmeterol (ADVAIR) 100-50 MCG/DOSE AEPB Inhale 1 puff into the lungs every 12 (twelve) hours.    Historical Provider, MD  HYDROcodone-acetaminophen (NORCO) 5-325 MG per tablet Take 1 tablet by mouth 2 (two) times daily as needed. Moderate to severe pain    Historical Provider, MD  insulin aspart (NOVOLOG) 100 UNIT/ML injection Inject 2-8 Units into the skin 3 (three) times daily before meals. Sliding scale//Accu-Checks before meals and at bedtime    Historical Provider, MD  ipratropium-albuterol (DUONEB) 0.5-2.5 (3) MG/3ML SOLN Take 3 mLs by nebulization every 6 (six) hours as needed. Shortness of breath    Historical Provider, MD  levothyroxine (SYNTHROID, LEVOTHROID) 75 MCG tablet Take 125 mcg by mouth daily.     Historical Provider, MD  lisinopril (PRINIVIL,ZESTRIL) 10 MG tablet Take 10 mg by mouth daily. Hold for systolic BP <105    Historical Provider, MD  Multiple Vitamin (MULITIVITAMIN WITH MINERALS) TABS Take 1 tablet by mouth daily.    Historical Provider, MD  omeprazole (PRILOSEC) 20 MG capsule Take 20 mg by mouth daily.  Historical Provider, MD  polyethylene glycol (MIRALAX / GLYCOLAX) packet Take 17 g by mouth daily.    Historical Provider, MD   BP 156/59 mmHg  Pulse 79  Temp(Src) 98.8 F (37.1 C) (Rectal)  Resp 14  SpO2 93% Physical Exam  Constitutional:  Elderly and very frail appearing  HENT:  Head: Normocephalic and atraumatic.  Dry mucus membranes  Eyes: Pupils are equal, round, and reactive to light. Right eye exhibits no discharge. Left eye exhibits no discharge.  Neck: Neck supple.  Cardiovascular: Normal rate and regular rhythm.   Exam reveals no gallop and no friction rub.   Murmur heard. Pulmonary/Chest: Breath sounds normal.  Mild tachycpnea  Abdominal: Soft. She exhibits no distension.  Musculoskeletal: She exhibits no edema or tenderness.  Pt moans/yells with even just light palpation to pretty much every where. No external signs of acute trauma. Passive ROM of large joints does not seem to illicit stronger/different response in any one particular area.   Neurological:  Laying in bed with eyes open. Makes eye contact. Would not respond to questions. Would yell "stop" with lab draws. No focal motor deficit noted.   Skin: Skin is warm and dry.  Nursing note and vitals reviewed.   ED Course  Procedures (including critical care time)  CRITICAL CARE Performed by: Raeford RazorKOHUT, Aritza Brunet   Total critical care time: 35 minutes  Critical care time was exclusive of separately billable procedures and treating other patients. Critical care was necessary to treat or prevent imminent or life-threatening deterioration. Critical care was time spent personally by me on the following activities: development of treatment plan with patient and/or surrogate as well as nursing, discussions with consultants, evaluation of patient's response to treatment, examination of patient, obtaining history from patient or surrogate, ordering and performing treatments and interventions, ordering and review of laboratory studies, ordering and review of radiographic studies, pulse oximetry and re-evaluation of patient's condition.  Labs Review Labs Reviewed  BASIC METABOLIC PANEL - Abnormal; Notable for the following:    Sodium 151 (*)    Glucose, Bld 493 (*)    BUN 59 (*)    GFR calc non Af Amer 44 (*)    GFR calc Af Amer 51 (*)    Anion gap 23 (*)    All other components within normal limits  CBC WITH DIFFERENTIAL - Abnormal; Notable for the following:    WBC 23.8 (*)    RBC 3.82 (*)    MCV 100.8 (*)    Neutrophils Relative % 91 (*)     Lymphocytes Relative 8 (*)    Monocytes Relative 1 (*)    Neutro Abs 21.7 (*)    All other components within normal limits  CBG MONITORING, ED - Abnormal; Notable for the following:    Glucose-Capillary 441 (*)    All other components within normal limits  URINALYSIS, ROUTINE W REFLEX MICROSCOPIC  BLOOD GAS, ARTERIAL    Imaging Review Dg Chest Portable 1 View  12/14/2013   CLINICAL DATA:  Cough for 2 days initial evaluation  EXAM: PORTABLE CHEST - 1 VIEW  COMPARISON:  05/14/2011  FINDINGS: Heart size normal. Mild central vascular congestion. Moderate diffuse interstitial prominence similar to prior study. No consolidation or effusion. Aortic arch calcifications stable.  IMPRESSION: Similar to prior study there is increased interstitial opacity bilaterally, slightly worse. In the setting of accompanying mild vascular congestion the findings are concerning for interstitial pulmonary edema.   Electronically Signed   By: Edgar Friskaymond  Rubner M.D.  On: 12/14/2013 20:18     EKG Interpretation   Date/Time:  Friday December 14 2013 19:47:33 EST Ventricular Rate:  74 PR Interval:  155 QRS Duration: 120 QT Interval:  420 QTC Calculation: 466 R Axis:   -50 Text Interpretation:  Sinus rhythm LVH with IVCD, LAD and secondary repol  abnrm Confirmed by Juleen China  MD, Leonora Gores ([id number]) on 12/14/2013 8:40:42 PM      MDM   Final diagnoses:  Cough  Severe dehydration  Hyperglycemia    80 year old female with cough?. Clinically she is dehydrated. Dry mucous membranes. She is  tachypneic but not what I would consider Kussmaul respirations. Radiology interpreting x-ray as possible interstitial pulmonary edema. Per my review of prior chest x-rays though, today's does not seem markedly changed. She has chronic interstitial prominence.  She needs hydration. She is hypernatremic. BUN significantly elevated with a normal creatinine. She has an elevated anion gap. Bicarbonate is low normal. IV fluids started.  Insulin drip. Will check an ABG.  She's has leukocytosis. She is afebrile though This could potentially be stress demargination versus infectious process. Will empirically cover with antibiotics for infection as a possible precipitant. Unfortunately unable to get any review of systems. Will check a troponin. EKG without overt ischemic changes.  Facility reports that pt has no DNR paperwork. [facility] has nothing on file for her. Tried calling daughter Jennelle Human who is listed as contact. [phone]. Son-in- Investment banker, operational. [phone]. Unfortunately no answer and voice mail not set up.    Raeford Razor, MD 12/14/13 [phone]

## 2013-12-14 NOTE — ED Notes (Signed)
Verbal order from Dr. Juleen ChinaKohut to discontinue insulin drip.

## 2013-12-15 DIAGNOSIS — F0280 Dementia in other diseases classified elsewhere without behavioral disturbance: Secondary | ICD-10-CM | POA: Diagnosis present

## 2013-12-15 DIAGNOSIS — E1165 Type 2 diabetes mellitus with hyperglycemia: Secondary | ICD-10-CM

## 2013-12-15 DIAGNOSIS — E876 Hypokalemia: Secondary | ICD-10-CM | POA: Diagnosis not present

## 2013-12-15 DIAGNOSIS — J189 Pneumonia, unspecified organism: Secondary | ICD-10-CM | POA: Diagnosis present

## 2013-12-15 DIAGNOSIS — G309 Alzheimer's disease, unspecified: Secondary | ICD-10-CM

## 2013-12-15 DIAGNOSIS — IMO0001 Reserved for inherently not codable concepts without codable children: Secondary | ICD-10-CM | POA: Diagnosis present

## 2013-12-15 LAB — MAGNESIUM: MAGNESIUM: 2.1 mg/dL (ref 1.5–2.5)

## 2013-12-15 LAB — GLUCOSE, CAPILLARY
GLUCOSE-CAPILLARY: 203 mg/dL — AB (ref 70–99)
GLUCOSE-CAPILLARY: 216 mg/dL — AB (ref 70–99)
Glucose-Capillary: 422 mg/dL — ABNORMAL HIGH (ref 70–99)
Glucose-Capillary: 248 mg/dL — ABNORMAL HIGH (ref 70–99)
Glucose-Capillary: 148 mg/dL — ABNORMAL HIGH (ref 70–99)
Glucose-Capillary: 239 mg/dL — ABNORMAL HIGH (ref 70–99)

## 2013-12-15 LAB — LACTIC ACID, PLASMA: Lactic Acid, Venous: 1.8 mmol/L (ref 0.5–2.2)

## 2013-12-15 LAB — CBC
HCT: 35.5 % — ABNORMAL LOW (ref 36.0–46.0)
Hemoglobin: 11.5 g/dL — ABNORMAL LOW (ref 12.0–15.0)
MCH: 32.5 pg (ref 26.0–34.0)
MCHC: 32.4 g/dL (ref 30.0–36.0)
MCV: 100.3 fL — ABNORMAL HIGH (ref 78.0–100.0)
PLATELETS: 311 10*3/uL (ref 150–400)
RBC: 3.54 MIL/uL — ABNORMAL LOW (ref 3.87–5.11)
RDW: 13.3 % (ref 11.5–15.5)
WBC: 22.9 10*3/uL — AB (ref 4.0–10.5)

## 2013-12-15 LAB — BASIC METABOLIC PANEL
Anion gap: 13 (ref 5–15)
BUN: 54 mg/dL — ABNORMAL HIGH (ref 6–23)
CALCIUM: 10.1 mg/dL (ref 8.4–10.5)
CO2: 27 mEq/L (ref 19–32)
Chloride: 116 mEq/L — ABNORMAL HIGH (ref 96–112)
Creatinine, Ser: 0.97 mg/dL (ref 0.50–1.10)
GFR calc Af Amer: 55 mL/min — ABNORMAL LOW (ref >90)
GFR calc non Af Amer: 47 mL/min — ABNORMAL LOW (ref >90)
Glucose, Bld: 261 mg/dL — ABNORMAL HIGH (ref 70–99)
Potassium: 2.8 mEq/L — CL (ref 3.7–5.3)
SODIUM: 156 meq/L — AB (ref 137–147)

## 2013-12-15 LAB — MRSA PCR SCREENING: MRSA BY PCR: POSITIVE — AB

## 2013-12-15 MED ORDER — MOMETASONE FURO-FORMOTEROL FUM 200-5 MCG/ACT IN AERO
1.0000 | INHALATION_SPRAY | Freq: Two times a day (BID) | RESPIRATORY_TRACT | Status: DC
  Filled 2013-12-15 (×18): qty 8.8

## 2013-12-15 MED ORDER — CETYLPYRIDINIUM CHLORIDE 0.05 % MT LIQD
7.0000 mL | Freq: Two times a day (BID) | OROMUCOSAL | Status: DC
  Administered 2013-12-15 – 2013-12-19 (×9): 7 mL via OROMUCOSAL

## 2013-12-15 MED ORDER — GUAIFENESIN ER 600 MG PO TB12
600.0000 mg | ORAL_TABLET | Freq: Two times a day (BID) | ORAL | Status: DC
  Administered 2013-12-15: 600 mg via ORAL
  Filled 2013-12-15: qty 1

## 2013-12-15 MED ORDER — VANCOMYCIN HCL 500 MG IV SOLR
500.0000 mg | INTRAVENOUS | Status: DC
  Administered 2013-12-15 – 2013-12-18 (×4): 500 mg via INTRAVENOUS
  Filled 2013-12-15 (×5): qty 500

## 2013-12-15 MED ORDER — MUPIROCIN 2 % EX OINT
1.0000 "application " | TOPICAL_OINTMENT | Freq: Two times a day (BID) | CUTANEOUS | Status: DC
  Administered 2013-12-15 – 2013-12-19 (×9): 1 via NASAL
  Filled 2013-12-15 (×2): qty 22

## 2013-12-15 MED ORDER — MOMETASONE FURO-FORMOTEROL FUM 200-5 MCG/ACT IN AERO
2.0000 | INHALATION_SPRAY | Freq: Two times a day (BID) | RESPIRATORY_TRACT | Status: DC
  Filled 2013-12-15: qty 8.8

## 2013-12-15 MED ORDER — SODIUM CHLORIDE 0.9 % IV SOLN: INTRAVENOUS | Status: DC

## 2013-12-15 MED ORDER — ADULT MULTIVITAMIN W/MINERALS CH: 1.0000 | ORAL_TABLET | Freq: Every day | ORAL | Status: DC

## 2013-12-15 MED ORDER — ONDANSETRON HCL 4 MG/2ML IJ SOLN: 4.0000 mg | Freq: Four times a day (QID) | INTRAMUSCULAR | Status: DC | PRN

## 2013-12-15 MED ORDER — IPRATROPIUM-ALBUTEROL 0.5-2.5 (3) MG/3ML IN SOLN: 3.0000 mL | Freq: Four times a day (QID) | RESPIRATORY_TRACT | Status: DC | PRN

## 2013-12-15 MED ORDER — CHLORHEXIDINE GLUCONATE CLOTH 2 % EX PADS
6.0000 | MEDICATED_PAD | Freq: Every day | CUTANEOUS | Status: AC
  Administered 2013-12-15 – 2013-12-19 (×5): 6 via TOPICAL

## 2013-12-15 MED ORDER — GUAIFENESIN-DM 100-10 MG/5ML PO SYRP
5.0000 mL | ORAL_SOLUTION | ORAL | Status: DC | PRN
Start: 2013-12-15 — End: 2013-12-15

## 2013-12-15 MED ORDER — TIOTROPIUM BROMIDE MONOHYDRATE 18 MCG IN CAPS
18.0000 ug | ORAL_CAPSULE | Freq: Every day | RESPIRATORY_TRACT | Status: DC
  Filled 2013-12-15: qty 5

## 2013-12-15 MED ORDER — LEVOTHYROXINE SODIUM 25 MCG PO TABS
125.0000 ug | ORAL_TABLET | Freq: Every day | ORAL | Status: DC
  Filled 2013-12-15 (×2): qty 1

## 2013-12-15 MED ORDER — INSULIN ASPART 100 UNIT/ML ~~LOC~~ SOLN
0.0000 [IU] | SUBCUTANEOUS | Status: DC
  Administered 2013-12-15 (×2): 3 [IU] via SUBCUTANEOUS
  Administered 2013-12-15: 1 [IU] via SUBCUTANEOUS
  Administered 2013-12-15 (×2): 3 [IU] via SUBCUTANEOUS
  Administered 2013-12-16 (×2): 2 [IU] via SUBCUTANEOUS

## 2013-12-15 MED ORDER — ENOXAPARIN SODIUM 30 MG/0.3ML ~~LOC~~ SOLN
30.0000 mg | SUBCUTANEOUS | Status: DC
  Administered 2013-12-15 – 2013-12-19 (×5): 30 mg via SUBCUTANEOUS
  Filled 2013-12-15 (×5): qty 0.3

## 2013-12-15 MED ORDER — INFLUENZA VAC SPLIT QUAD 0.5 ML IM SUSY
0.5000 mL | PREFILLED_SYRINGE | INTRAMUSCULAR | Status: AC
  Administered 2013-12-16: 0.5 mL via INTRAMUSCULAR
  Filled 2013-12-15: qty 0.5

## 2013-12-15 MED ORDER — DEXTROSE 5 % IV SOLN
1.0000 g | INTRAVENOUS | Status: DC
  Administered 2013-12-15 – 2013-12-19 (×5): 1 g via INTRAVENOUS
  Filled 2013-12-15 (×6): qty 1

## 2013-12-15 MED ORDER — POTASSIUM CHLORIDE 10 MEQ/100ML IV SOLN
10.0000 meq | INTRAVENOUS | Status: AC
Start: 2013-12-15 — End: 2013-12-15
  Administered 2013-12-15 (×2): 10 meq via INTRAVENOUS
  Filled 2013-12-15 (×2): qty 100

## 2013-12-15 MED ORDER — SODIUM CHLORIDE 0.45 % IV SOLN
INTRAVENOUS | Status: DC
  Administered 2013-12-15: 10:00:00 via INTRAVENOUS
  Filled 2013-12-15 (×6): qty 1000

## 2013-12-15 MED ORDER — SODIUM CHLORIDE 0.9 % IV SOLN
INTRAVENOUS | Status: DC
  Administered 2013-12-15: 01:00:00 via INTRAVENOUS

## 2013-12-15 MED ORDER — LEVOTHYROXINE SODIUM 100 MCG IV SOLR
60.0000 ug | Freq: Every day | INTRAVENOUS | Status: DC
  Administered 2013-12-15 – 2013-12-17 (×3): 60 ug via INTRAVENOUS
  Filled 2013-12-15 (×5): qty 5

## 2013-12-15 MED ORDER — IPRATROPIUM-ALBUTEROL 0.5-2.5 (3) MG/3ML IN SOLN
3.0000 mL | Freq: Three times a day (TID) | RESPIRATORY_TRACT | Status: DC
  Administered 2013-12-15 – 2013-12-17 (×8): 3 mL via RESPIRATORY_TRACT
  Filled 2013-12-15 (×9): qty 3

## 2013-12-15 MED ORDER — ONDANSETRON HCL 4 MG PO TABS
4.0000 mg | ORAL_TABLET | Freq: Four times a day (QID) | ORAL | Status: DC | PRN
Start: 2013-12-15 — End: 2013-12-19

## 2013-12-15 MED ORDER — CALCIUM CARBONATE 1250 (500 CA) MG PO TABS
500.0000 mg | ORAL_TABLET | Freq: Two times a day (BID) | ORAL | Status: DC
  Filled 2013-12-15 (×5): qty 1

## 2013-12-15 NOTE — Progress Notes (Signed)
Respiratory reported that patient is not able to use the inhalers.  Notified MD.

## 2013-12-15 NOTE — Progress Notes (Addendum)
ANTIBIOTIC CONSULT NOTE  Pharmacy Consult for Vancomycin & Cefepime Indication: pneumonia  Allergies  Allergen Reactions  . Penicillins     Patient Measurements: Height: 5\' 4"  (162.6 cm) Weight: 114 lb 4.8 oz (51.846 kg) IBW/kg (Calculated) : 54.7  Vital Signs: Temp: 98.2 F (36.8 C) (11/14 0404) Temp Source: Axillary (11/14 0404) BP: 145/55 mmHg (11/14 0404) Pulse Rate: 71 (11/14 0404) Intake/Output from previous day:   Intake/Output from this shift:    Labs:  Recent Labs  12/14/13 1933 12/15/13 0605  WBC 23.8* 22.9*  HGB 12.4 11.5*  PLT 201 311  CREATININE 1.03 0.97   Estimated Creatinine Clearance: 26.5 mL/min (by C-G formula based on Cr of 0.97). No results for input(s): VANCOTROUGH, VANCOPEAK, VANCORANDOM, GENTTROUGH, GENTPEAK, GENTRANDOM, TOBRATROUGH, TOBRAPEAK, TOBRARND, AMIKACINPEAK, AMIKACINTROU, AMIKACIN in the last 72 hours.   Microbiology: Recent Results (from the past 720 hour(s))  Blood culture (routine x 2)     Status: None (Preliminary result)   Collection Time: 12/14/13  9:45 PM  Result Value Ref Range Status   Specimen Description BLOOD RIGHT ARM  Final   Special Requests BOTTLES DRAWN AEROBIC ONLY 4CC  Final   Culture NO GROWTH 1 DAY  Final   Report Status PENDING  Incomplete  MRSA PCR Screening     Status: Abnormal   Collection Time: 12/15/13  1:46 AM  Result Value Ref Range Status   MRSA by PCR POSITIVE (A) NEGATIVE Final    Comment:        The GeneXpert MRSA Assay (FDA approved for NASAL specimens only), is one component of a comprehensive MRSA colonization surveillance program. It is not intended to diagnose MRSA infection nor to guide or monitor treatment for MRSA infections. RESULT CALLED TO, READ BACK BY AND VERIFIED WITH:  JOHNSON,B @ 0428 ON 12/15/13 BY WOODIE,J     Anti-infectives    Start     Dose/Rate Route Frequency Ordered Stop   12/14/13 2115  vancomycin (VANCOCIN) IVPB 1000 mg/200 mL premix     1,000 mg200 mL/hr  over 60 Minutes Intravenous  Once 12/14/13 2113 12/14/13 2345   12/14/13 2115  ceFEPIme (MAXIPIME) 1 g in dextrose 5 % 50 mL IVPB  Status:  Discontinued     1 g100 mL/hr over 30 Minutes Intravenous  Once 12/14/13 2113 12/15/13 0017      Assessment: 89 yoF admitted from NH with cough and hyperglycemia.   Elevated WBC noted, however she is afebrile and lactic acid level is normal.   CXR + interstitial pulmonary edema. Renal function is at patient's baseline.  She received 1gm Vancomycin yesterday at 10pm. PCN allergy noted, but per hospital records she has tolerated cephalosporins previously.  Vancomycin 11/13>> Cefepime 11/14>>  Goal of Therapy:  Vancomycin trough level 15-20 mcg/ml  Plan:  Cefepime 1gm IV q24h Vancomycin 500mg  IV q24h Check Vancomycin trough at steady state Monitor renal function and cx data   Elson ClanLilliston, Tonjia Parillo Michelle 12/15/2013,8:50 AM

## 2013-12-15 NOTE — Progress Notes (Signed)
[facility] PROGRESS NOTE  Christina Douglas ZOX:[id number] DOB: 10-10-1913 DOA: 12/14/2013 PCP: Terald Sleeper, MD    Code Status:  Full code; we'll need to confirm this. Family Communication: family not available Disposition Plan: discharged to skilled nursing facility when clinically appropriate.   Consultants:  none  Procedures:  none  Antibiotics:  Cefepime 11/14 >>  Vancomycin 11/14>>  HPI/Subjective: Patient is sitting up in bed in no acute distress. She only says "cold" when I examined her. Otherwise, she appears to be chronically confused, frail, and debilitated.  Objective: Filed Vitals:   12/15/13 0404  BP: 145/55  Pulse: 71  Temp: 98.2 F (36.8 C)  Resp: 15    Intake/Output Summary (Last 24 hours) at 12/15/13 1209 Last data filed at 12/15/13 0900  Gross per 24 hour  Intake      0 ml  Output      0 ml  Net      0 ml   Filed Weights   12/15/13 0016  Weight: 51.846 kg (114 lb 4.8 oz)    Exam:   General:  Frail debilitated 80 year old sitting up in bed, in no apparent distress. She says "cold" because of the examining physicians hands.  Cardiovascular: S1, S2, with soft systolic murmur.  Respiratory: few scattered crackles; decreased breath sounds in the bases.  Abdomen: positive bowel sounds, soft, nontender, nondistended.  Musculoskeletal: diffuse arthritic hypertrophic changes and mild muscle wasting; no acute hot red joints.  Neurologic: The patient is alert and gives eye contact to name. She says "cold" because of the examiner's cold hands. Otherwise she does not follow directions. Cranial nerves II through XII appear to be grossly intact.   Data Reviewed: Basic Metabolic Panel:  Recent Labs Lab 12/14/13 1933 12/15/13 0605  NA 151* 156*  K 3.8 2.8*  CL 108 116*  CO2 20 27  GLUCOSE 493* 261*  BUN 59* 54*  CREATININE 1.03 0.97  CALCIUM 10.4 10.1  MG  --  2.1   Liver Function Tests: No results for input(s): AST,  ALT, ALKPHOS, BILITOT, PROT, ALBUMIN in the last 168 hours. No results for input(s): LIPASE, AMYLASE in the last 168 hours. No results for input(s): AMMONIA in the last 168 hours. CBC:  Recent Labs Lab 12/14/13 1933 12/15/13 0605  WBC 23.8* 22.9*  NEUTROABS 21.7*  --   HGB 12.4 11.5*  HCT 38.5 35.5*  MCV 100.8* 100.3*  PLT 201 311   Cardiac Enzymes:  Recent Labs Lab 12/14/13 1933  TROPONINI <0.30   BNP (last 3 results) No results for input(s): PROBNP in the last 8760 hours. CBG:  Recent Labs Lab 12/14/13 2254 12/15/13 0054 12/15/13 0400 12/15/13 0738 12/15/13 1125  GLUCAP 414* 422* 248* 203* 148*    Recent Results (from the past 240 hour(s))  Blood culture (routine x 2)     Status: None (Preliminary result)   Collection Time: 12/14/13  9:45 PM  Result Value Ref Range Status   Specimen Description BLOOD RIGHT ARM  Final   Special Requests BOTTLES DRAWN AEROBIC ONLY 4CC  Final   Culture NO GROWTH 1 DAY  Final   Report Status PENDING  Incomplete  MRSA PCR Screening     Status: Abnormal   Collection Time: 12/15/13  1:46 AM  Result Value Ref Range Status   MRSA by PCR POSITIVE (A) NEGATIVE Final    Comment:        The GeneXpert MRSA Assay (FDA approved for NASAL specimens only), is one component  of a comprehensive MRSA colonization surveillance program. It is not intended to diagnose MRSA infection nor to guide or monitor treatment for MRSA infections. RESULT CALLED TO, READ BACK BY AND VERIFIED WITH:  JOHNSON,B @ 0428 ON 12/15/13 BY WOODIE,J      Studies: Dg Chest Portable 1 View  12/14/2013   CLINICAL DATA:  Cough for 2 days initial evaluation  EXAM: PORTABLE CHEST - 1 VIEW  COMPARISON:  05/14/2011  FINDINGS: Heart size normal. Mild central vascular congestion. Moderate diffuse interstitial prominence similar to prior study. No consolidation or effusion. Aortic arch calcifications stable.  IMPRESSION: Similar to prior study there is increased  interstitial opacity bilaterally, slightly worse. In the setting of accompanying mild vascular congestion the findings are concerning for interstitial pulmonary edema.   Electronically Signed   By: Esperanza Heiraymond  Rubner M.D.   On: 12/14/2013 20:18    Scheduled Meds: . antiseptic oral rinse  7 mL Mouth Rinse BID  . ceFEPime (MAXIPIME) IV  1 g Intravenous Q24H  . Chlorhexidine Gluconate Cloth  6 each Topical Q0600  . enoxaparin (LOVENOX) injection  30 mg Subcutaneous Q24H  . [START ON 12/16/2013] Influenza vac split quadrivalent PF  0.5 mL Intramuscular Tomorrow-1000  . insulin aspart  0-9 Units Subcutaneous 6 times per day  . levothyroxine  60 mcg Intravenous QAC breakfast  . mometasone-formoterol  2 puff Inhalation BID  . mupirocin ointment  1 application Nasal BID  . tiotropium  18 mcg Inhalation Daily  . vancomycin  500 mg Intravenous Q24H   Continuous Infusions: . sodium chloride 0.45 % with kcl 75 mL/hr at 12/15/13 [id number]   Assessment and plan:  Principal Problem:   Dehydration with hypernatremia Active Problems:   HCAP (healthcare-associated pneumonia)   Chronic diastolic heart failure   Benign hypertension   Chronic anemia   Hypothyroidism   Diabetes mellitus type 2, uncontrolled, without complications   Dementia of the Alzheimer's type   Hypokalemia    1. Severe dehydration with hypernatremia. This appears to be recurrent. It is likely secondary to progressive dementia and poor oral intake. This is superimposed on probable healthcare acquired pneumonia. --We'll continue IV hydration, but IV fluids changed to half-normal saline as her serum potassium has increased. -- I  will make her nothing by mouth as she does not appear to be alert enough or oriented enough to take oral medications.  Probable healthcare acquired pneumonia. Her chest x-ray revealed no definite infiltrate, but with her cough and leukocytosis, will treat as such. Cefepime and vancomycin have been started. Will  provide duo nebs every 8 hours. -we'll discontinue Spiriva as the patient does not appear to understand how to use it per respiratory therapy.  Hypokalemia, likely secondary to IV fluids. No reported history of diarrhea or vomiting. Will start repletion with 2 runs of potassium chloride and added 40 mEq to maintenance IV fluids. Magnesium level was within normal limits.  Type 2 diabetes mellitus. Her venous glucose was greater than 400 on admission. We'll continue Sliding scale NovoLog close monitoring of CBGs given nothing by mouth status. Current CBG is reasonable.  Hypothyroidism. Oral Synthroid discontinued. IV Synthroid ordered. TSH ordered and pending.  Chronic diastolic heart failure. Currently stable. Will consider gentle IV Lasix if needed.  Anemia, likely secondary to chronic disease. We'll continue to monitor with no further diagnostic workup or intervention given her age and chronic debility.  Dementia, likely Alzheimer's versus vascular dementia. Rather advanced. We'll try to contact family regarding CODE STATUS. She does  not appear to be a candidate for a PEG tube if she does not improve clinically with medical management.     Time spent: 35 minutes    [facility]akan[facility] HospitalFISHER,Savvas Roper  [facility] Pager [phone]. If 7PM-7AM, please contact night-coverage at [email or web address], password [email or web address] 12/15/2013, 12:09 PM  LOS: 1 day

## 2013-12-15 NOTE — Plan of Care (Signed)
Problem: Consults Goal: Skin Care Protocol Initiated - if Braden Score 18 or less If consults are not indicated, leave blank or document N/A  Outcome: Completed/Met Date Met:  12/15/13 Goal: Diabetes Guidelines if Diabetic/Glucose > 140 If diabetic or lab glucose is > 140 mg/dl - Initiate Diabetes/Hyperglycemia Guidelines & Document Interventions  Outcome: Completed/Met Date Met:  12/15/13

## 2013-12-15 NOTE — Progress Notes (Signed)
Pt had critical lab value: K+ 2.8. Dr. Sherrie MustacheFisher ordered 2 runs of K+. Will continue to monitor.

## 2013-12-15 NOTE — Progress Notes (Signed)
CRITICAL VALUE ALERT  Critical value received:  K-2.8  Date of notification:  12/15/13  Time of notification:  0724  Critical value read back:Yes.    Nurse who received alert:  Cyndia DiverLindsi Forte  MD notified (1st page):  Fisher  Time of first page:  0727  MD notified (2nd page):  Time of second page:  Responding MD:  Dr. Sherrie MustacheFisher   Time MD responded:  [phone]

## 2013-12-15 NOTE — Plan of Care (Signed)
Problem: Phase I Progression Outcomes Goal: Pain controlled with appropriate interventions Outcome: Progressing Goal: OOB as tolerated unless otherwise ordered Outcome: Not Progressing     

## 2013-12-16 DIAGNOSIS — R7881 Bacteremia: Secondary | ICD-10-CM | POA: Diagnosis not present

## 2013-12-16 DIAGNOSIS — E870 Hyperosmolality and hypernatremia: Secondary | ICD-10-CM | POA: Diagnosis not present

## 2013-12-16 LAB — GLUCOSE, CAPILLARY
GLUCOSE-CAPILLARY: 197 mg/dL — AB (ref 70–99)
GLUCOSE-CAPILLARY: 226 mg/dL — AB (ref 70–99)
Glucose-Capillary: 179 mg/dL — ABNORMAL HIGH (ref 70–99)
Glucose-Capillary: 191 mg/dL — ABNORMAL HIGH (ref 70–99)
Glucose-Capillary: 244 mg/dL — ABNORMAL HIGH (ref 70–99)
Glucose-Capillary: 245 mg/dL — ABNORMAL HIGH (ref 70–99)

## 2013-12-16 LAB — CBC
HEMATOCRIT: 36.5 % (ref 36.0–46.0)
Hemoglobin: 11.6 g/dL — ABNORMAL LOW (ref 12.0–15.0)
MCH: 32.2 pg (ref 26.0–34.0)
MCHC: 31.8 g/dL (ref 30.0–36.0)
MCV: 101.4 fL — AB (ref 78.0–100.0)
Platelets: 282 10*3/uL (ref 150–400)
RBC: 3.6 MIL/uL — ABNORMAL LOW (ref 3.87–5.11)
RDW: 13.6 % (ref 11.5–15.5)
WBC: 19.6 10*3/uL — ABNORMAL HIGH (ref 4.0–10.5)

## 2013-12-16 LAB — TSH: TSH: 0.17 u[IU]/mL — AB (ref 0.350–4.500)

## 2013-12-16 LAB — BASIC METABOLIC PANEL
Anion gap: 13 (ref 5–15)
BUN: 47 mg/dL — ABNORMAL HIGH (ref 6–23)
CALCIUM: 9.7 mg/dL (ref 8.4–10.5)
CO2: 24 meq/L (ref 19–32)
CREATININE: 0.98 mg/dL (ref 0.50–1.10)
Chloride: 121 mEq/L — ABNORMAL HIGH (ref 96–112)
GFR calc Af Amer: 54 mL/min — ABNORMAL LOW (ref >90)
GFR calc non Af Amer: 46 mL/min — ABNORMAL LOW (ref >90)
GLUCOSE: 218 mg/dL — AB (ref 70–99)
Potassium: 3.9 mEq/L (ref 3.7–5.3)
Sodium: 158 mEq/L — ABNORMAL HIGH (ref 137–147)

## 2013-12-16 LAB — HEMOGLOBIN A1C
Hgb A1c MFr Bld: 8.6 % — ABNORMAL HIGH (ref <5.7)
MEAN PLASMA GLUCOSE: 200 mg/dL — AB (ref <117)

## 2013-12-16 MED ORDER — POTASSIUM CHLORIDE 2 MEQ/ML IV SOLN
INTRAVENOUS | Status: DC
  Administered 2013-12-16 – 2013-12-18 (×3): via INTRAVENOUS
  Filled 2013-12-16 (×8): qty 1000

## 2013-12-16 MED ORDER — INSULIN GLARGINE 100 UNIT/ML ~~LOC~~ SOLN
7.0000 [IU] | Freq: Every day | SUBCUTANEOUS | Status: DC
  Administered 2013-12-16: 7 [IU] via SUBCUTANEOUS
  Filled 2013-12-16 (×2): qty 0.07

## 2013-12-16 MED ORDER — LORAZEPAM 2 MG/ML IJ SOLN
0.5000 mg | Freq: Once | INTRAMUSCULAR | Status: AC
  Administered 2013-12-16: 0.5 mg via INTRAVENOUS
  Filled 2013-12-16: qty 1

## 2013-12-16 MED ORDER — HYDRALAZINE HCL 20 MG/ML IJ SOLN
5.0000 mg | Freq: Four times a day (QID) | INTRAMUSCULAR | Status: DC
  Administered 2013-12-16 – 2013-12-19 (×12): 5 mg via INTRAVENOUS
  Filled 2013-12-16 (×12): qty 1

## 2013-12-16 MED ORDER — INSULIN ASPART 100 UNIT/ML ~~LOC~~ SOLN
0.0000 [IU] | SUBCUTANEOUS | Status: DC
  Administered 2013-12-16: 3 [IU] via SUBCUTANEOUS
  Administered 2013-12-16 (×3): 5 [IU] via SUBCUTANEOUS
  Administered 2013-12-17 (×2): 3 [IU] via SUBCUTANEOUS
  Administered 2013-12-17 (×2): 8 [IU] via SUBCUTANEOUS
  Administered 2013-12-17: 2 [IU] via SUBCUTANEOUS
  Administered 2013-12-17: 5 [IU] via SUBCUTANEOUS
  Administered 2013-12-18: 3 [IU] via SUBCUTANEOUS
  Administered 2013-12-18: 5 [IU] via SUBCUTANEOUS
  Administered 2013-12-18: 2 [IU] via SUBCUTANEOUS
  Administered 2013-12-18: 3 [IU] via SUBCUTANEOUS
  Administered 2013-12-18: 8 [IU] via SUBCUTANEOUS
  Administered 2013-12-19: 3 [IU] via SUBCUTANEOUS
  Administered 2013-12-19: 2 [IU] via SUBCUTANEOUS

## 2013-12-16 MED ORDER — FUROSEMIDE 10 MG/ML IJ SOLN
20.0000 mg | Freq: Once | INTRAMUSCULAR | Status: AC
  Administered 2013-12-16: 20 mg via INTRAVENOUS
  Filled 2013-12-16: qty 2

## 2013-12-16 NOTE — Progress Notes (Signed)
CRITICAL VALUE ALERT  Critical value received:  Blood culture gram positive cocci  Date of notification:  12/16/2013  Time of notification:  0730  Critical value read back: Yes  Nurse who received alert:  T.James, RN  MD notified (1st page):  Fisher  Time of first page:  0745  Responding MD:  Sherrie MustacheFisher  Time MD responded:  [phone]

## 2013-12-16 NOTE — Plan of Care (Signed)
Problem: Phase I Progression Outcomes Goal: OOB as tolerated unless otherwise ordered Outcome: Not Met (add Reason)     

## 2013-12-16 NOTE — Progress Notes (Signed)
[facility] PROGRESS NOTE  Christina Douglas ZOX:[id number] DOB: Dec 29, 1913 DOA: 12/14/2013 PCP: Terald Sleeper, MD    Code Status:  Full code; we'll need to confirm this. Family Communication: family not available Disposition Plan: discharged to skilled nursing facility when clinically appropriate.   Consultants:  none  Procedures:  none  Antibiotics:  Cefepime 11/14 >>  Vancomycin 11/14>>  HPI/Subjective: Patient is sitting up in bed in no acute distress. Otherwise, she appears to be chronically confused, frail, and debilitated.  Objective: Filed Vitals:   12/16/13 0531  BP: 160/74  Pulse: 79  Temp: 97.9 F (36.6 C)  Resp: 16  Oxygen saturation 94%  Intake/Output Summary (Last 24 hours) at 12/16/13 1211 Last data filed at 12/16/13 0900  Gross per 24 hour  Intake      0 ml  Output      0 ml  Net      0 ml   Filed Weights   12/15/13 0016  Weight: 51.846 kg (114 lb 4.8 oz)    Exam:   General:  Frail debilitated 80 year old sitting up in bed, in no apparent distress.  Cardiovascular: S1, S2, with 2/6 systolic murmur.  Respiratory: few scattered crackles; decreased breath sounds in the bases.  Abdomen: positive bowel sounds, soft, mild hypogastric tenderness; no rigidity  Musculoskeletal: diffuse arthritic hypertrophic changes and mild muscle wasting; no acute hot red joints.  Neurologic: The patient is sleeping but becomes alert to her name. She is not verbal this morning. Otherwise she does not follow directions. Cranial nerves II through XII appear to be grossly intact.   Data Reviewed: Basic Metabolic Panel:  Recent Labs Lab 12/14/13 1933 12/15/13 0605 12/16/13 0554  NA 151* 156* 158*  K 3.8 2.8* 3.9  CL 108 116* 121*  CO2 20 27 24   GLUCOSE 493* 261* 218*  BUN 59* 54* 47*  CREATININE 1.03 0.97 0.98  CALCIUM 10.4 10.1 9.7  MG  --  2.1  --    Liver Function Tests: No results for input(s): AST, ALT, ALKPHOS, BILITOT, PROT,  ALBUMIN in the last 168 hours. No results for input(s): LIPASE, AMYLASE in the last 168 hours. No results for input(s): AMMONIA in the last 168 hours. CBC:  Recent Labs Lab 12/14/13 1933 12/15/13 0605 12/16/13 0554  WBC 23.8* 22.9* 19.6*  NEUTROABS 21.7*  --   --   HGB 12.4 11.5* 11.6*  HCT 38.5 35.5* 36.5  MCV 100.8* 100.3* 101.4*  PLT 201 311 282   Cardiac Enzymes:  Recent Labs Lab 12/14/13 1933  TROPONINI <0.30   BNP (last 3 results) No results for input(s): PROBNP in the last 8760 hours. CBG:  Recent Labs Lab 12/15/13 2004 12/16/13 0001 12/16/13 0530 12/16/13 0752 12/16/13 1130  GLUCAP 216* 197* 179* 191* 244*    Recent Results (from the past 240 hour(s))  Blood culture (routine x 2)     Status: None (Preliminary result)   Collection Time: 12/14/13  9:45 PM  Result Value Ref Range Status   Specimen Description BLOOD RIGHT ARM  Final   Special Requests BOTTLES DRAWN AEROBIC ONLY 4CC  Final   Culture   Final    GRAM POSITIVE COCCI IN CLUSTERS Gram Stain Report Called to,Read Back By and Verified With: JAMES T. AT 0720A ON [id number] BY THOMPSON S.    Report Status PENDING  Incomplete  MRSA PCR Screening     Status: Abnormal   Collection Time: 12/15/13  1:46 AM  Result Value Ref Range Status  MRSA by PCR POSITIVE (A) NEGATIVE Final    Comment:        The GeneXpert MRSA Assay (FDA approved for NASAL specimens only), is one component of a comprehensive MRSA colonization surveillance program. It is not intended to diagnose MRSA infection nor to guide or monitor treatment for MRSA infections. RESULT CALLED TO, READ BACK BY AND VERIFIED WITH:  JOHNSON,B @ 0428 ON 12/15/13 BY WOODIE,J      Studies: Dg Chest Portable 1 View  12/14/2013   CLINICAL DATA:  Cough for 2 days initial evaluation  EXAM: PORTABLE CHEST - 1 VIEW  COMPARISON:  05/14/2011  FINDINGS: Heart size normal. Mild central vascular congestion. Moderate diffuse interstitial prominence similar  to prior study. No consolidation or effusion. Aortic arch calcifications stable.  IMPRESSION: Similar to prior study there is increased interstitial opacity bilaterally, slightly worse. In the setting of accompanying mild vascular congestion the findings are concerning for interstitial pulmonary edema.   Electronically Signed   By: Esperanza Heiraymond  Rubner M.D.   On: 12/14/2013 20:18    Scheduled Meds: . antiseptic oral rinse  7 mL Mouth Rinse BID  . ceFEPime (MAXIPIME) IV  1 g Intravenous Q24H  . Chlorhexidine Gluconate Cloth  6 each Topical Q0600  . enoxaparin (LOVENOX) injection  30 mg Subcutaneous Q24H  . hydrALAZINE  5 mg Intravenous Q6H  . insulin aspart  0-15 Units Subcutaneous 6 times per day  . ipratropium-albuterol  3 mL Nebulization 3 times per day  . levothyroxine  60 mcg Intravenous QAC breakfast  . mometasone-formoterol  2 puff Inhalation BID  . mupirocin ointment  1 application Nasal BID  . vancomycin  500 mg Intravenous Q24H   Continuous Infusions: . dextrose 5 % with kcl 100 mL/hr at 12/16/13 0947   Assessment and plan:  Principal Problem:   Dehydration with hypernatremia Active Problems:   HCAP (healthcare-associated pneumonia)   Gram-positive cocci bacteremia   Chronic diastolic heart failure   Benign hypertension   Chronic anemia   Hypothyroidism   Diabetes mellitus type 2, uncontrolled, without complications   Dementia of the Alzheimer's type   Hypokalemia    1. Severe dehydration with hypernatremia. This appears to be recurrent. It is likely secondary to progressive dementia and poor oral intake. This is superimposed on probable healthcare acquired pneumonia. Her serum sodium is increasing even after IV fluids changed to half-normal saline. Fluids have been changed to D5 W with potassium added. We'll continue to monitor her serum sodium. Continue nothing by mouth status as she does not appear to be alert enough or oriented enough to take oral medications.  Probable  healthcare acquired pneumonia. Her chest x-ray on admission revealed no definite infiltrate, but with her cough and leukocytosis, we are treating her as such. Cefepime and vancomycin have been started. Will continue duo nebs every 8 hours.  Spiriva was discontinued as the patient does not appear to understand how to use it per respiratory therapy.  Hypokalemia, likely secondary to IV fluids. No reported history of diarrhea or vomiting. Improved serum potassium. Status post repletion with 2 runs of potassium chloride and added 40 mEq to maintenance IV fluids. Magnesium level was within normal limits.  Type 2 diabetes mellitus. Her venous glucose was greater than 400 on admission. We'll continue Sliding scale NovoLog and close monitoring of CBGs given nothing by mouth status. She is receiving dextrose and IV fluids, so we'll add Lantus. Hemoglobin A1c ordered and pending.  Hypothyroidism. Oral Synthroid discontinued. IV Synthroid ordered.  TSH ordered and pending.  Hypertension. Lisinopril is on hold. Her blood pressure is trending up. Hydralazine added IV for treatment. We'll also give 1 dose of IV Lasix. Consider clonidine patch, but this may make her more sedated.  Chronic diastolic heart failure. Currently stable.   Anemia, likely secondary to chronic disease. We'll continue to monitor with no further diagnostic workup or intervention given her age and chronic debility.  Dementia, likely Alzheimer's versus vascular dementia. Rather advanced. We'll try to contact family regarding CODE STATUS. She does not appear to be a candidate for a PEG tube if she does not improve clinically with medical management. We'll continue dextrose and IV fluids and give thiamine daily 2. Nursing staff has tried to contact her family, but no answer.     Time spent: 30 minutes    [facility] Regional Medical CenterFISHER,Artisha Capri  [facility] Pager [phone]. If 7PM-7AM, please contact night-coverage at [email or web address], password  [facility] 12/16/2013, 12:11 PM  LOS: 2 days

## 2013-12-17 ENCOUNTER — Inpatient Hospital Stay (HOSPITAL_COMMUNITY): Payer: Medicare Other

## 2013-12-17 LAB — CULTURE, BLOOD (ROUTINE X 2)

## 2013-12-17 LAB — BASIC METABOLIC PANEL
Anion gap: 10 (ref 5–15)
BUN: 38 mg/dL — ABNORMAL HIGH (ref 6–23)
CALCIUM: 9 mg/dL (ref 8.4–10.5)
CO2: 24 meq/L (ref 19–32)
CREATININE: 1.05 mg/dL (ref 0.50–1.10)
Chloride: 114 mEq/L — ABNORMAL HIGH (ref 96–112)
GFR calc Af Amer: 50 mL/min — ABNORMAL LOW (ref >90)
GFR, EST NON AFRICAN AMERICAN: 43 mL/min — AB (ref >90)
Glucose, Bld: 286 mg/dL — ABNORMAL HIGH (ref 70–99)
Potassium: 4.2 mEq/L (ref 3.7–5.3)
Sodium: 148 mEq/L — ABNORMAL HIGH (ref 137–147)

## 2013-12-17 LAB — GLUCOSE, CAPILLARY
GLUCOSE-CAPILLARY: 250 mg/dL — AB (ref 70–99)
GLUCOSE-CAPILLARY: 147 mg/dL — AB (ref 70–99)
Glucose-Capillary: 124 mg/dL — ABNORMAL HIGH (ref 70–99)
Glucose-Capillary: 199 mg/dL — ABNORMAL HIGH (ref 70–99)
Glucose-Capillary: 209 mg/dL — ABNORMAL HIGH (ref 70–99)
Glucose-Capillary: 255 mg/dL — ABNORMAL HIGH (ref 70–99)

## 2013-12-17 LAB — CBC
HCT: 32.4 % — ABNORMAL LOW (ref 36.0–46.0)
HEMOGLOBIN: 10.3 g/dL — AB (ref 12.0–15.0)
MCH: 31.9 pg (ref 26.0–34.0)
MCHC: 31.8 g/dL (ref 30.0–36.0)
MCV: 100.3 fL — ABNORMAL HIGH (ref 78.0–100.0)
Platelets: 276 10*3/uL (ref 150–400)
RBC: 3.23 MIL/uL — ABNORMAL LOW (ref 3.87–5.11)
RDW: 13.6 % (ref 11.5–15.5)
WBC: 16.3 10*3/uL — ABNORMAL HIGH (ref 4.0–10.5)

## 2013-12-17 MED ORDER — INSULIN GLARGINE 100 UNIT/ML ~~LOC~~ SOLN
10.0000 [IU] | Freq: Every day | SUBCUTANEOUS | Status: DC
  Administered 2013-12-17 – 2013-12-18 (×2): 10 [IU] via SUBCUTANEOUS
  Filled 2013-12-17 (×3): qty 0.1

## 2013-12-17 MED ORDER — LORAZEPAM 2 MG/ML IJ SOLN
1.0000 mg | Freq: Once | INTRAMUSCULAR | Status: AC
  Administered 2013-12-17: 1 mg via INTRAVENOUS
  Filled 2013-12-17: qty 1

## 2013-12-17 MED ORDER — IPRATROPIUM-ALBUTEROL 0.5-2.5 (3) MG/3ML IN SOLN: 3.0000 mL | Freq: Four times a day (QID) | RESPIRATORY_TRACT | Status: DC | PRN

## 2013-12-17 NOTE — Progress Notes (Addendum)
Nutrition Brief Note  Patient identified on the Braden Report   Wt Readings from Last 15 Encounters:  12/15/13 114 lb 4.8 oz (51.846 kg)  05/15/11 101 lb 13.6 oz (46.2 kg)    Body mass index is 19.61 kg/(m^2). Patient meets criteria for normal based on current BMI.   Current diet order is NPO. She is not alert enough for po intake at this time.    Labs and medications reviewed. Skin is intact.  No nutrition interventions warranted. She has advancing dementia and and is not likely a candidate for enteral feeding and percutaneous tube placement. MD plans to follow-up with family regarding code status.    Royann ShiversLynn Vinay Ertl MS,RD,CSG,LDN Office: [phone] Pager: [phone]

## 2013-12-17 NOTE — Care Management Note (Addendum)
    Page 1 of 1   12/19/2013     11:23:41 AM CARE MANAGEMENT NOTE 12/19/2013  Patient:  Christina Douglas,Christina Douglas   Account Number:  [id number]  Date Initiated:  12/17/2013  Documentation initiated by:  Kathyrn SheriffHILDRESS,JESSICA  Subjective/Objective Assessment:   [facility]     Action/Plan:   [facility]. CSW aware of Discharge plan. No CM needs at this time.   Anticipated DC Date:  12/19/2013   Anticipated DC Plan:  SKILLED NURSING FACILITY  In-house referral  Clinical Social Worker      DC Planning Services  CM consult      Choice offered to / List presented to:             Status of service:  Completed, signed off Medicare Important Message given?  YES (If response is "NO", the following Medicare IM given date fields will be blank) Date Medicare IM given:  12/19/2013 Medicare IM given by:  Kathyrn SheriffHILDRESS,JESSICA Date Additional Medicare IM given:   Additional Medicare IM given by:    Discharge Disposition:  SKILLED NURSING FACILITY  Per UR Regulation:    If discussed at Long Length of Stay Meetings, dates discussed:    Comments:  12/19/2013 1100 Kathyrn SheriffJessica Childress, RN, MSN, PCCN [facility]. No CM needs identified at this time.  12/17/2013 1100 Kathyrn SheriffJessica Childress, RN, MSN, [employer]CC[employer]

## 2013-12-17 NOTE — Care Management Utilization Note (Signed)
UR review complete.  

## 2013-12-17 NOTE — Clinical Social Work Psychosocial (Signed)
Clinical Social Work Department BRIEF PSYCHOSOCIAL ASSESSMENT 12/17/2013  Patient:  Caffie PintoGILBERT,Ranika M     Account Number:  [id number]     Admit date:  12/14/2013  Clinical Social Worker:  Sherrlyn HockSTULTZ,Lakitha Gordy, LCSWA  Date/Time:  12/17/2013 01:22 PM  Referred by:  CSW  Date Referred:  12/17/2013 Referred for  SNF Placement   Other Referral:   Interview type:  Other - See comment Other interview type:   Tresa EndoKelly- [address]  Shon BatonFelissa Ferrell- DSS    PSYCHOSOCIAL DATA Living Status:  FACILITY [facility] Level of care:  Skilled Nursing Facility Primary support name:  ? Primary support relationship to patient:   Degree of support available:   Pt's daughter/son-in-law have not been involved for some time [facility].    CURRENT CONCERNS Current Concerns  Post-Acute Placement   Other Concerns:    SOCIAL WORK ASSESSMENT / PLAN CSW [facility] as she is oriented to self only and not communicating much. Tresa EndoKelly states that [facility]. Her daughter and son-in-law, Tomma LightningFrankie and Josie SaundersGerard were involved at that time. However, they have not had contact with them for some time. [facility] and hospital staff have tried all weekend to get in touch with them. CSW left voicemail for Josie SaundersGerard as no voicemail set up on daughter's phone. Pt admitted due to dehydration with hypernatremia. She has dementia and has had poor oral intake. MD would like goals of care conversation with family, but unable to reach. CSW discussed with Shon BatonFelissa [facility] and CSW supervisor. Felissa reports no involvement with pt or family in past and [facility] does not have address for family. MD notified that physician can make pt DNR with multiple unsuccessful attempts to reach family per discussion with supervisor. Tresa EndoKelly reports pt is nursing level of care and okay to return. They are open to pursue  guardianship/hospice if needed upon return. Tresa EndoKelly said that pt has made all decisions in the past regarding her care. [facility] based on pt's mental status at d/c.   Assessment/plan status:  Psychosocial Support/Ongoing Assessment of Needs Other assessment/ plan:   Information/referral to community resources:   [facility]    PATIENT'S/FAMILY'S RESPONSE TO PLAN OF CARE: Pt unable to participate in assessment. No family involvement. CSW will continue to follow.       Derenda FennelKara Julann Mcgilvray, KentuckyLCSW [phone]

## 2013-12-17 NOTE — Progress Notes (Signed)
[facility] PROGRESS NOTE  Christina Douglas OZD:[id number] DOB: 1913-05-28 DOA: 12/14/2013 PCP: Christina Sleeper, MD  Assessment/Plan: 1. Severe dehydration with hypernatremia. It is likely secondary to progressive dementia and poor oral intake in the setting of probable healthcare acquired pneumonia. She is afebrile and hemodynamically stable.  Her serum sodium trended downward today with  IV fluids of Christina Douglas with potassium added. More alert today. Continue to monitor.  Probable healthcare acquired pneumonia. Her chest x-ray on admission revealed no definite infiltrate, but cough and leukocytosis are concerning. WBC tending downward today. She is afebrile and non-toxic appearing. Continue Christina Douglas and Christina Douglas day #2.  Will continue duo nebs every 8 hours as well.Spiriva was discontinued as the patient does not appear to understand how to use it per respiratory therapy.  Hypokalemia, likely secondary to IV fluids. No reported history of diarrhea or vomiting. Resolved with IV fluids and additive. Magnesium level was within normal limits. Continue to monitor  Type 2 diabetes mellitus. Her venous glucose was greater than 400 on admission. Hg A1c6,1 9/15 per chart. Glipizide discontinued at that time. A1C 8.6.5 on admission. We'll continue Sliding scale Christina Douglas with close monitoring of CBGs. She is receiving dextrose and IV fluids. Christina Douglas increased per recommendation diabetes coordinator.   Hypothyroidism.  TSH 0.170. Will hold synthroid.   Hypertension.  fair control with Lisinopril is on hold. Hydralazine added IV for treatment. Consider clonidine patch if control worsens.  Chronic diastolic heart failure. Remains stable.   Anemia, likely secondary to chronic disease.  Stable. Plan is to  monitor with no further diagnostic workup or intervention given her age and chronic debility.  Dementia, likely Alzheimer's versus vascular dementia. Rather advanced. Unable to contact family  to date. See Social worker note. Christina Douglas and Christina Douglas supervisor consulted. Reportedly family has had no contact with patient for quite a while and Christina Douglas has no address. Attempted to call daughter and unable to leave voice mail as service not set up. Left message for Christina Douglas to call me back. In the meantime, given advanced age, advanced dementia a CODE STATUS of Christina Douglas appropriate. She does not appear to be a candidate for a PEG tube.   Code Status: Christina Douglas Family Communication: left message for Christina Douglas Disposition Plan: back to facility when clinically ready hopefully 24-48 hours   Consultants:  none  Procedures:  none  Antibiotics:  Christina Douglas 11/14 >>  Christina Douglas 11/14>>  HPI/Subjective: Lying in bed eyes closed. Awakens to slight touch. Moans/yells Christina Douglas  Objective: Filed Vitals:   12/17/13 1010  BP: 147/58  Pulse: 79  Temp:   Resp:     Intake/Output Summary (Last 24 hours) at 12/17/13 1421 Last data filed at 12/17/13 1300  Gross per 24 hour  Intake 2071.67 ml  Output      0 ml  Net 2071.67 ml   Filed Weights   12/15/13 0016  Weight: 51.846 kg (114 lb 4.8 oz)    Exam:   General:  Slightly pale and frail appears comfortable  Cardiovascular: RRR No MGR no LE edema  Respiratory: normal effort slightly shallow no rhonchi or wheezes  Abdomen: obese soft +BS non-tender to palpation   Musculoskeletal: no swelling/erythema   Neuro: lethargic but responds to verbal stimuli. Unable to follow commands or make needs known.   Data Reviewed: Basic Metabolic Panel:  Recent Labs Lab 12/14/13 1933 12/15/13 0605 12/16/13 0554 12/17/13 0535  NA 151* 156* 158* 148*  K 3.8 2.8* 3.9 4.2  CL 108  116* 121* 114*  CO2 20 27 24 24   GLUCOSE 493* 261* 218* 286*  BUN 59* 54* 47* 38*  CREATININE 1.03 0.97 0.98 1.05  CALCIUM 10.4 10.1 9.7 9.0  MG  --  2.1  --   --    Liver Function Tests: No results for input(s): AST, ALT, ALKPHOS, BILITOT, PROT, ALBUMIN in the last  168 hours. No results for input(s): LIPASE, AMYLASE in the last 168 hours. No results for input(s): AMMONIA in the last 168 hours. CBC:  Recent Labs Lab 12/14/13 1933 12/15/13 0605 12/16/13 0554 12/17/13 0535  WBC 23.8* 22.9* 19.6* 16.3*  NEUTROABS 21.7*  --   --   --   HGB 12.4 11.5* 11.6* 10.3*  HCT 38.5 35.5* 36.5 32.4*  MCV 100.8* 100.3* 101.4* 100.3*  PLT 201 311 282 276   Cardiac Enzymes:  Recent Labs Lab 12/14/13 1933  TROPONINI <0.30   BNP (last 3 results) No results for input(s): PROBNP in the last 8760 hours. CBG:  Recent Labs Lab 12/16/13 1633 12/16/13 2007 12/17/13 0025 12/17/13 0350 12/17/13 0727  GLUCAP 245* 226* 209* 199* 250*    Recent Results (from the past 240 hour(s))  Blood culture (routine x 2)     Status: None   Collection Time: 12/14/13  9:45 PM  Result Value Ref Range Status   Specimen Description BLOOD RIGHT ARM  Final   Special Requests BOTTLES DRAWN AEROBIC ONLY 4CC  Final   Culture  Setup Time   Final    12/17/2013 00:17 [facility]formed at [facility]    Culture   Final    STAPHYLOCOCCUS SPECIES (COAGULASE NEGATIVE) Note: THE SIGNIFICANCE OF ISOLATING THIS ORGANISM FROM A SINGLE VENIPUNCTURE CANNOT BE PREDICTED WITHOUT FURTHER CLINICAL AND CULTURE CORRELATION. SUSCEPTIBILITIES AVAILABLE ONLY ON REQUEST. Note: Gram Stain Report Called to,Read Back By and Verified With: JAMES T.@0720AM  ON 12/16/13 BY THOMPSON S. [facility] at [facility] Performed at [facility]    Report Status 12/17/2013 FINAL  Final  MRSA PCR Screening     Status: Abnormal   Collection Time: 12/15/13  1:46 AM  Result Value Ref Range Status   MRSA by PCR POSITIVE (A) NEGATIVE Final    Comment:        The GeneXpert MRSA Assay (FDA approved for NASAL specimens only), is one component of a comprehensive MRSA colonization surveillance program. It is not intended to diagnose MRSA infection nor to guide or monitor treatment for MRSA  infections. RESULT CALLED TO, READ BACK BY AND VERIFIED WITH:  JOHNSON,B @ 0428 ON 12/15/13 BY Anner CreteWOODIE,J      Studies: Dg Abd Acute Douglas/chest  12/17/2013   CLINICAL DATA:  80 year old diabetic hypertensive female with generalized abdominal pain. Subsequent encounter.  EXAM: ACUTE ABDOMEN SERIES (ABDOMEN 2 VIEW & CHEST 1 VIEW)  COMPARISON:  12/14/2013 09/04/2011 chest x-ray.  FINDINGS: Cardiomegaly. Pulmonary vascular prominence superimposed upon chronic lung changes. No segmental consolidation. Crowding of lung markings felt to be responsible for opacity along peripheral aspect right mid lung.  Calcified mildly tortuous aorta.  Nonspecific bowel gas pattern without plain film evidence of bowel obstruction or free intraperitoneal air.  Gallstones suspected.  Prior right hip surgery.  Scoliosis lumbar spine.  IMPRESSION: Nonspecific bowel gas pattern without plain film evidence of bowel obstruction or free intraperitoneal air.  Gallstones suspected.  Pulmonary vascular prominence superimposed upon chronic lung changes.  Cardiomegaly.  Calcified slightly tortuous aorta.   Electronically Signed   By: Kandice HamsSteve  Olson M.D.  On: 12/17/2013 11:53    Scheduled Meds: . antiseptic oral rinse  7 mL Mouth Rinse BID  . Christina Douglas (MAXIPIME) IV  1 g Intravenous Q24H  . Chlorhexidine Gluconate Cloth  6 each Topical Q0600  . enoxaparin (LOVENOX) injection  30 mg Subcutaneous Q24H  . hydrALAZINE  5 mg Intravenous Q6H  . insulin aspart  0-15 Units Subcutaneous 6 times per day  . insulin glargine  10 Units Subcutaneous QHS  . ipratropium-albuterol  3 mL Nebulization 3 times per day  . levothyroxine  60 mcg Intravenous QAC breakfast  . mometasone-formoterol  2 puff Inhalation BID  . mupirocin ointment  1 application Nasal BID  . Christina Douglas  500 mg Intravenous Q24H   Continuous Infusions: . dextrose 5 % with kcl 100 mL/hr at 12/16/13 [id number]    Principal Problem:   Dehydration with hypernatremia Active Problems:    Chronic diastolic heart failure   Benign hypertension   Chronic anemia   Hypothyroidism   Diabetes mellitus type 2, uncontrolled, without complications   HCAP (healthcare-associated pneumonia)   Dementia of the Alzheimer's type   Hypokalemia   Gram-positive cocci bacteremia    Time spent: 35 minutes    [facility] LLCBLACK,Madelina Sanda M  [facility] Pager [phone]. If 7PM-7AM, please contact night-coverage at [email or web address], password [facility] 12/17/2013, 2:21 PM  LOS: 3 days

## 2013-12-17 NOTE — Plan of Care (Signed)
Problem: Phase I Progression Outcomes Goal: Voiding-avoid urinary catheter unless indicated Outcome: Completed/Met Date Met:  12/17/13     

## 2013-12-17 NOTE — Progress Notes (Signed)
Inpatient Diabetes Program Recommendations  AACE/ADA: New Consensus Statement on Inpatient Glycemic Control (2013)  Target Ranges:  Prepandial:   less than 140 mg/dL      Peak postprandial:   less than 180 mg/dL (1-2 hours)      Critically ill patients:  140 - 180 mg/dL   Results for Christina Douglas, Avryl M (MRN [id number]) as of 12/17/2013 09:20  Ref. Range 12/16/2013 05:30 12/16/2013 07:52 12/16/2013 11:30 12/16/2013 16:33 12/16/2013 20:07 12/17/2013 00:25 12/17/2013 03:50 12/17/2013 07:27  Glucose-Capillary Latest Range: 70-99 mg/dL 562179 (H) 130191 (H) 865244 (H) 245 (H) 226 (H) 209 (H) 199 (H) 250 (H)   Diabetes history: DM2 Outpatient Diabetes medications: None Current orders for Inpatient glycemic control: Lantus 7 units QHS, Novolog 0-15 units Q4H  Inpatient Diabetes Program Recommendations Insulin - Basal: Please consider increasing Lantus to 10 units QHS.  Note: In reviewing the chart, noted note on 10/03/13  By Dr. Leanord Hawkingobson that patient was taking Glipizide 2.5 mg daily which was discontinued on 10/03/13 because A1C was 6.1% and Dr. Leanord Hawkingobson felt "6.1 is too tight of control for a very elderly woman". A1C on 12/16/13 was 8.6% and initial glucose was 493 mg/dl on 78/46/9611/13/15 at 29:5219:33. Patient will need outpatient diabetes medication for glycemic control. Patient received Lantus 7 units last night and fasting glucose this morning is 250 mg/dl. Please consider increasing Lantus to 10 units QHS.   Thanks, Orlando PennerMarie Brandom Kerwin, RN, MSN, CCRN, CDE Diabetes Coordinator Inpatient Diabetes Program [phone] (Team Pager) [phone] ([facility] office)  [phone] [facility] office)

## 2013-12-18 LAB — GLUCOSE, CAPILLARY
GLUCOSE-CAPILLARY: 263 mg/dL — AB (ref 70–99)
Glucose-Capillary: 118 mg/dL — ABNORMAL HIGH (ref 70–99)
Glucose-Capillary: 141 mg/dL — ABNORMAL HIGH (ref 70–99)
Glucose-Capillary: 165 mg/dL — ABNORMAL HIGH (ref 70–99)
Glucose-Capillary: 177 mg/dL — ABNORMAL HIGH (ref 70–99)
Glucose-Capillary: 212 mg/dL — ABNORMAL HIGH (ref 70–99)

## 2013-12-18 LAB — BASIC METABOLIC PANEL
ANION GAP: 12 (ref 5–15)
BUN: 31 mg/dL — ABNORMAL HIGH (ref 6–23)
CO2: 20 mEq/L (ref 19–32)
Calcium: 8.6 mg/dL (ref 8.4–10.5)
Chloride: 104 mEq/L (ref 96–112)
Creatinine, Ser: 1.06 mg/dL (ref 0.50–1.10)
GFR, EST AFRICAN AMERICAN: 49 mL/min — AB (ref >90)
GFR, EST NON AFRICAN AMERICAN: 42 mL/min — AB (ref >90)
Glucose, Bld: 287 mg/dL — ABNORMAL HIGH (ref 70–99)
POTASSIUM: 4.8 meq/L (ref 3.7–5.3)
SODIUM: 136 meq/L — AB (ref 137–147)

## 2013-12-18 LAB — CBC
HCT: 35.1 % — ABNORMAL LOW (ref 36.0–46.0)
Hemoglobin: 11.2 g/dL — ABNORMAL LOW (ref 12.0–15.0)
MCH: 31.6 pg (ref 26.0–34.0)
MCHC: 31.9 g/dL (ref 30.0–36.0)
MCV: 99.2 fL (ref 78.0–100.0)
PLATELETS: 257 10*3/uL (ref 150–400)
RBC: 3.54 MIL/uL — AB (ref 3.87–5.11)
RDW: 13.5 % (ref 11.5–15.5)
WBC: 16 10*3/uL — AB (ref 4.0–10.5)

## 2013-12-18 LAB — CLOSTRIDIUM DIFFICILE BY PCR: Toxigenic C. Difficile by PCR: NEGATIVE

## 2013-12-18 MED ORDER — SODIUM CHLORIDE 0.9 % IV SOLN
INTRAVENOUS | Status: DC
  Administered 2013-12-18: 23:00:00 via INTRAVENOUS

## 2013-12-18 MED ORDER — LORAZEPAM 2 MG/ML IJ SOLN
1.0000 mg | Freq: Once | INTRAMUSCULAR | Status: AC
  Administered 2013-12-18: 1 mg via INTRAVENOUS
  Filled 2013-12-18: qty 1

## 2013-12-18 NOTE — Plan of Care (Signed)
Problem: Phase II Progression Outcomes Goal: Vital signs remain stable Outcome: Progressing     

## 2013-12-18 NOTE — Progress Notes (Signed)
ANTIBIOTIC CONSULT NOTE  Pharmacy Consult for Vancomycin & Cefepime Indication: pneumonia  Allergies  Allergen Reactions  . Latex   . Penicillins    Patient Measurements: Height: 5\' 4"  (162.6 cm) Weight: 114 lb 4.8 oz (51.846 kg) IBW/kg (Calculated) : 54.7  Vital Signs: Temp: 98.5 F (36.9 C) (11/17 0331) Temp Source: Oral (11/17 0331) BP: 124/70 mmHg (11/17 0331) Pulse Rate: 82 (11/17 0331) Intake/Output from previous day:   Intake/Output from this shift:    Labs:  Recent Labs  12/16/13 0554 12/17/13 0535 12/18/13 0825  WBC 19.6* 16.3* 16.0*  HGB 11.6* 10.3* 11.2*  PLT 282 276 257  CREATININE 0.98 1.05 1.06   Estimated Creatinine Clearance: 24.2 mL/min (by C-G formula based on Cr of 1.06). No results for input(s): VANCOTROUGH, VANCOPEAK, VANCORANDOM, GENTTROUGH, GENTPEAK, GENTRANDOM, TOBRATROUGH, TOBRAPEAK, TOBRARND, AMIKACINPEAK, AMIKACINTROU, AMIKACIN in the last 72 hours.   Microbiology: Recent Results (from the past 720 hour(s))  Blood culture (routine x 2)     Status: None   Collection Time: 12/14/13  9:45 PM  Result Value Ref Range Status   Specimen Description BLOOD RIGHT ARM  Final   Special Requests BOTTLES DRAWN AEROBIC ONLY 4CC  Final   Culture  Setup Time   Final    12/17/2013 00:17 Performed at [facility]    Culture   Final    STAPHYLOCOCCUS SPECIES (COAGULASE NEGATIVE) Note: THE SIGNIFICANCE OF ISOLATING THIS ORGANISM FROM A SINGLE VENIPUNCTURE CANNOT BE PREDICTED WITHOUT FURTHER CLINICAL AND CULTURE CORRELATION. SUSCEPTIBILITIES AVAILABLE ONLY ON REQUEST. Note: Gram Stain Report Called to,Read Back By and Verified With: JAMES T.@0720AM  ON 12/16/13 BY THOMPSON S. Performed at [facility] Performed at [facility]    Report Status 12/17/2013 FINAL  Final  MRSA PCR Screening     Status: Abnormal   Collection Time: 12/15/13  1:46 AM  Result Value Ref Range Status   MRSA by PCR POSITIVE (A) NEGATIVE Final    Comment:         The GeneXpert MRSA Assay (FDA approved for NASAL specimens only), is one component of a comprehensive MRSA colonization surveillance program. It is not intended to diagnose MRSA infection nor to guide or monitor treatment for MRSA infections. RESULT CALLED TO, READ BACK BY AND VERIFIED WITH:  JOHNSON,B @ 0428 ON 12/15/13 BY WOODIE,J     Anti-infectives    Start     Dose/Rate Route Frequency Ordered Stop   12/15/13 2200  vancomycin (VANCOCIN) 500 mg in sodium chloride 0.9 % 100 mL IVPB     500 mg100 mL/hr over 60 Minutes Intravenous Every 24 hours 12/15/13 0855     12/15/13 1000  ceFEPIme (MAXIPIME) 1 g in dextrose 5 % 50 mL IVPB     1 g100 mL/hr over 30 Minutes Intravenous Every 24 hours 12/15/13 0855     12/14/13 2115  vancomycin (VANCOCIN) IVPB 1000 mg/200 mL premix     1,000 mg200 mL/hr over 60 Minutes Intravenous  Once 12/14/13 2113 12/14/13 2345   12/14/13 2115  ceFEPIme (MAXIPIME) 1 g in dextrose 5 % 50 mL IVPB  Status:  Discontinued     1 g100 mL/hr over 30 Minutes Intravenous  Once 12/14/13 2113 12/15/13 0017     Assessment: 89 yoF admitted from NH with cough and hyperglycemia.   Elevated WBC noted, however she is afebrile and lactic acid level is normal.   CXR + interstitial pulmonary edema. Renal function is at patient's baseline and stable. PCN allergy noted,  but per hospital records she has tolerated cephalosporins previously and no RXN noted this admission.  Vancomycin 11/13>> Cefepime 11/14>>  Goal of Therapy:  Vancomycin trough level 15-20 mcg/ml  Plan:  Cefepime 1gm IV q24h Vancomycin 500mg  IV q24h Check Vancomycin trough at steady state Monitor renal function and cx data   Valrie HartHall, Shahan Starks A 12/18/2013,1:25 PM

## 2013-12-18 NOTE — Progress Notes (Signed)
Pt has become agitated and yelling out. Order for ativan acknowledged. Will give one time dose.

## 2013-12-18 NOTE — Progress Notes (Signed)
Pt agitated and calling out. One time dose of ativan given.

## 2013-12-18 NOTE — Progress Notes (Signed)
[facility] PROGRESS NOTE  Christina Douglas NWG:[id number] DOB: 04/22/1913 DOA: 12/14/2013 PCP: Terald SleeperOBSON,MICHAEL GAVIN, MD  Assessment/Plan: 1. Severe dehydration with hypernatremia. It is likely secondary to progressive dementia and poor oral intake in the setting of probable healthcare acquired pneumonia. She is afebrile and hemodynamically stable.  Her serum sodium trended downward to below normal range at 136 today. We'll discontinue free water and change fluids to normal saline.   Probable healthcare acquired pneumonia. Her chest x-ray on admission revealed no definite infiltrate, but cough and leukocytosis are concerning. WBC tending downward today. She is afebrile and non-toxic appearing. Continue Cefepime and vancomycin day #3.  Will continue duo nebs every 8 hours as well.Spiriva was discontinued as the patient does not appear to understand how to use it per respiratory therapy.  Coagulase-negative, Staphylococcus bacteremia. Likely a contaminant. Continue vancomycin for now.  Reported loose stools. C. Difficile PCR has been ordered.  Hypokalemia, likely secondary to IV fluids. No reported history of diarrhea or vomiting. Resolved with IV fluids and additive. Magnesium level was within normal limits. Continue to monitor  Type 2 diabetes mellitus. Her venous glucose was greater than 400 on admission. Hg A1c6,1 9/15 per chart. Glipizide discontinued at that time. A1C 8.6.5 on admission. We'll continue Sliding scale NovoLog and Lantus with close monitoring of CBGs. Will stop dextrose and IV fluids which she has been receiving. This will help her CBGs.  Hypothyroidism.  TSH 0.170. Will hold synthroid.  Will hold off on checking a free T4.  Hypertension. Good control with Lisinopril is on hold. Hydralazine added IV for treatment. Consider clonidine patch if control worsens.  Chronic diastolic heart failure. Remains stable.   Anemia, likely secondary to chronic disease.   Stable. Plan is to  monitor with no further diagnostic workup or intervention given her age and chronic debility.  Dementia, likely Alzheimer's versus vascular dementia. Rather advanced. Unable to contact family to date. See Social worker note. DSS and CSW supervisor consulted. Reportedly family has had no contact with patient for quite a while and [facility]. Attempted to call daughter and unable to leave voice mail as service not set up. Left message for Josie SaundersGerard to call  back. In the meantime, given advanced age, advanced dementia a CODE STATUS of DNR was appropriate and indicated.  She does not appear to be a candidate for a PEG tube. She appears to be too confused to take any medications, liquids, or solids by mouth. She appears to be hospice ready. Would discharge her back to the skilled nursing facility with hospice if no family contacts the hospital or nursing facility.  Code Status: DNR Family Communication: left message for Josie SaundersGerard son in law Disposition Plan: back to facility when clinically ready hopefully 24-48 hours   Consultants:  none  Procedures:  none  Antibiotics:  Cefepime 11/14 >>  Vancomycin 11/14>>  HPI/Subjective: Lying in in bed, arousable to name, and yells out when she tries to be examined.  Objective: Filed Vitals:   12/18/13 1502  BP: 126/74  Pulse: 88  Temp: 98.4 F (36.9 C)  Resp: 18    Intake/Output Summary (Last 24 hours) at 12/18/13 1724 Last data filed at 12/18/13 1506  Gross per 24 hour  Intake   3410 ml  Output      0 ml  Net   3410 ml   Filed Weights   12/15/13 0016  Weight: 51.846 kg (114 lb 4.8 oz)    Exam:   General:  Frail-appearing elderly 80 year old woman in no distress until she is examined.  Cardiovascular;  S1, S2, with a 2/6 systolic murmur.  Respiratory: breathing nonlabored. Lungs clear anteriorly with decreased breath sounds in the bases.  Abdomen: obese soft +BS non-tender to palpation    Musculoskeletal: no swelling/erythema   Neuro: she is sleeping but becomes alert and agitated when she is being examined.   Data Reviewed: Basic Metabolic Panel:  Recent Labs Lab 12/14/13 1933 12/15/13 0605 12/16/13 0554 12/17/13 0535 12/18/13 0825  NA 151* 156* 158* 148* 136*  K 3.8 2.8* 3.9 4.2 4.8  CL 108 116* 121* 114* 104  CO2 20 27 24 24 20   GLUCOSE 493* 261* 218* 286* 287*  BUN 59* 54* 47* 38* 31*  CREATININE 1.03 0.97 0.98 1.05 1.06  CALCIUM 10.4 10.1 9.7 9.0 8.6  MG  --  2.1  --   --   --    Liver Function Tests: No results for input(s): AST, ALT, ALKPHOS, BILITOT, PROT, ALBUMIN in the last 168 hours. No results for input(s): LIPASE, AMYLASE in the last 168 hours. No results for input(s): AMMONIA in the last 168 hours. CBC:  Recent Labs Lab 12/14/13 1933 12/15/13 0605 12/16/13 0554 12/17/13 0535 12/18/13 0825  WBC 23.8* 22.9* 19.6* 16.3* 16.0*  NEUTROABS 21.7*  --   --   --   --   HGB 12.4 11.5* 11.6* 10.3* 11.2*  HCT 38.5 35.5* 36.5 32.4* 35.1*  MCV 100.8* 100.3* 101.4* 100.3* 99.2  PLT 201 311 282 276 257   Cardiac Enzymes:  Recent Labs Lab 12/14/13 1933  TROPONINI <0.30   BNP (last 3 results) No results for input(s): PROBNP in the last 8760 hours. CBG:  Recent Labs Lab 12/18/13 0007 12/18/13 0332 12/18/13 0727 12/18/13 1111 12/18/13 1631  GLUCAP 141* 165* 263* 212* 118*    Recent Results (from the past 240 hour(s))  Blood culture (routine x 2)     Status: None   Collection Time: 12/14/13  9:45 PM  Result Value Ref Range Status   Specimen Description BLOOD RIGHT ARM  Final   Special Requests BOTTLES DRAWN AEROBIC ONLY 4CC  Final   Culture  Setup Time   Final    12/17/2013 00:17 Performed at [facility]    Culture   Final    STAPHYLOCOCCUS SPECIES (COAGULASE NEGATIVE) Note: THE SIGNIFICANCE OF ISOLATING THIS ORGANISM FROM A SINGLE VENIPUNCTURE CANNOT BE PREDICTED WITHOUT FURTHER CLINICAL AND CULTURE CORRELATION.  SUSCEPTIBILITIES AVAILABLE ONLY ON REQUEST. Note: Gram Stain Report Called to,Read Back By and Verified With: JAMES T.@0720AM  ON 12/16/13 BY THOMPSON S. Performed at [facility] Performed at [facility]    Report Status 12/17/2013 FINAL  Final  MRSA PCR Screening     Status: Abnormal   Collection Time: 12/15/13  1:46 AM  Result Value Ref Range Status   MRSA by PCR POSITIVE (A) NEGATIVE Final    Comment:        The GeneXpert MRSA Assay (FDA approved for NASAL specimens only), is one component of a comprehensive MRSA colonization surveillance program. It is not intended to diagnose MRSA infection nor to guide or monitor treatment for MRSA infections. RESULT CALLED TO, READ BACK BY AND VERIFIED WITH:  JOHNSON,B @ 0428 ON 12/15/13 BY Anner CreteWOODIE,J      Studies: Dg Abd Acute W/chest  12/17/2013   CLINICAL DATA:  80 year old diabetic hypertensive female with generalized abdominal pain. Subsequent encounter.  EXAM: ACUTE ABDOMEN SERIES (ABDOMEN 2 VIEW &  CHEST 1 VIEW)  COMPARISON:  12/14/2013 09/04/2011 chest x-ray.  FINDINGS: Cardiomegaly. Pulmonary vascular prominence superimposed upon chronic lung changes. No segmental consolidation. Crowding of lung markings felt to be responsible for opacity along peripheral aspect right mid lung.  Calcified mildly tortuous aorta.  Nonspecific bowel gas pattern without plain film evidence of bowel obstruction or free intraperitoneal air.  Gallstones suspected.  Prior right hip surgery.  Scoliosis lumbar spine.  IMPRESSION: Nonspecific bowel gas pattern without plain film evidence of bowel obstruction or free intraperitoneal air.  Gallstones suspected.  Pulmonary vascular prominence superimposed upon chronic lung changes.  Cardiomegaly.  Calcified slightly tortuous aorta.   Electronically Signed   By: Bridgett Larsson M.D.   On: 12/17/2013 11:53    Scheduled Meds: . antiseptic oral rinse  7 mL Mouth Rinse BID  . ceFEPime (MAXIPIME) IV  1 g  Intravenous Q24H  . Chlorhexidine Gluconate Cloth  6 each Topical Q0600  . enoxaparin (LOVENOX) injection  30 mg Subcutaneous Q24H  . hydrALAZINE  5 mg Intravenous Q6H  . insulin aspart  0-15 Units Subcutaneous 6 times per day  . insulin glargine  10 Units Subcutaneous QHS  . mometasone-formoterol  2 puff Inhalation BID  . mupirocin ointment  1 application Nasal BID  . vancomycin  500 mg Intravenous Q24H   Continuous Infusions: . dextrose 5 % with kcl 100 mL/hr at 12/18/13 1610    Principal Problem:   Dehydration with hypernatremia Active Problems:   HCAP (healthcare-associated pneumonia)   Gram-positive cocci bacteremia   Chronic diastolic heart failure   Benign hypertension   Chronic anemia   Hypothyroidism   Diabetes mellitus type 2, uncontrolled, without complications   Dementia of the Alzheimer's type   Hypokalemia    Time spent: 35 minutes    [facility]  [facility] Pager [phone]. If 7PM-7AM, please contact night-coverage at [email or web address], password [facility] 12/18/2013, 5:24 PM  LOS: 4 days

## 2013-12-19 LAB — GLUCOSE, CAPILLARY
GLUCOSE-CAPILLARY: 154 mg/dL — AB (ref 70–99)
GLUCOSE-CAPILLARY: 98 mg/dL (ref 70–99)
GLUCOSE-CAPILLARY: 126 mg/dL — AB (ref 70–99)
Glucose-Capillary: 89 mg/dL (ref 70–99)

## 2013-12-19 LAB — BASIC METABOLIC PANEL
Anion gap: 12 (ref 5–15)
BUN: 28 mg/dL — ABNORMAL HIGH (ref 6–23)
CALCIUM: 8.9 mg/dL (ref 8.4–10.5)
CO2: 20 mEq/L (ref 19–32)
Chloride: 107 mEq/L (ref 96–112)
Creatinine, Ser: 1.13 mg/dL — ABNORMAL HIGH (ref 0.50–1.10)
GFR calc Af Amer: 45 mL/min — ABNORMAL LOW (ref >90)
GFR calc non Af Amer: 39 mL/min — ABNORMAL LOW (ref >90)
GLUCOSE: 83 mg/dL (ref 70–99)
POTASSIUM: 4.8 meq/L (ref 3.7–5.3)
SODIUM: 139 meq/L (ref 137–147)

## 2013-12-19 LAB — CBC
HCT: 35.1 % — ABNORMAL LOW (ref 36.0–46.0)
Hemoglobin: 11.2 g/dL — ABNORMAL LOW (ref 12.0–15.0)
MCH: 31.4 pg (ref 26.0–34.0)
MCHC: 31.9 g/dL (ref 30.0–36.0)
MCV: 98.3 fL (ref 78.0–100.0)
Platelets: 350 10*3/uL (ref 150–400)
RBC: 3.57 MIL/uL — AB (ref 3.87–5.11)
RDW: 13.5 % (ref 11.5–15.5)
WBC: 17.3 10*3/uL — AB (ref 4.0–10.5)

## 2013-12-19 MED ORDER — LEVOFLOXACIN 500 MG PO TABS: 500.0000 mg | ORAL_TABLET | Freq: Every day | ORAL | Status: DC

## 2013-12-19 NOTE — Clinical Social Work Note (Signed)
[facility] Still no contact from family. [facility] agreeable to consider guardianship/hospice referral upon return. D/C summary will be faxed upon completion. [facility].  Derenda FennelKara Sherley Mckenney, [place] [phone]

## 2013-12-19 NOTE — Discharge Summary (Signed)
Physician Discharge Summary  Christina Douglas VHQ:[id number] DOB: Feb 14, 1913 DOA: 12/14/2013  PCP: Terald SleeperOBSON,MICHAEL GAVIN, MD  Admit date: 12/14/2013 Discharge date: 12/19/2013  Time spent:  minutes  Recommendations for Outpatient Follow-up:  1. PCP 1-2 weeks for evaluation of symptoms and resolution of HCAP. Follow TSH 2. Discharge to facility. Recommend CBG monitoring  Discharge Diagnoses:  Principal Problem:   Dehydration with hypernatremia Active Problems:   Chronic diastolic heart failure   Benign hypertension   Chronic anemia   Hypothyroidism   Diabetes mellitus type 2, uncontrolled, without complications   HCAP (healthcare-associated pneumonia)   Dementia of the Alzheimer's type   Hypokalemia   Gram-positive cocci bacteremia   Discharge Condition: stable  Diet recommendation: soft  Filed Weights   12/15/13 0016  Weight: 51.846 kg (114 lb 4.8 oz)    History of present illness:  80 yo female h/o diastolic chf, htn, adv dementia, dm sent to ED on 12/14/13 by snf for cough. No fever. Reportedly had not been eating well. Pt unable to provide much history due to her dementia.   Hospital Course:  1. Severe dehydration with hypernatremia. zlikely secondary to progressive dementia and poor oral intake in the setting of probable healthcare acquired pneumonia. She was afebrile and hemodynamically stable. Provided with supportive therapy and at discharge sodium 139.   Probable healthcare acquired pneumonia. Her chest x-ray on admission revealed no definite infiltrate, but cough and leukocytosis  concerning. WBC trending down at discharge.  She remained afebrile and non-toxic appearing. Provided with IV antibiotics and will discharge with Levaquin for 7 days.   Reported loose stools. C. Difficile PCR negative.  Hypokalemia, likely secondary to IV fluids. No reported history of diarrhea or vomiting. Resolved with IV fluids and additive. Magnesium level was within normal  limits.  Type 2 diabetes mellitus. Her venous glucose was greater than 400 on admission. Hg A1c 6,1 9/15 per chart.   Hypothyroidism. TSH 0.170.   Hypertension. Good control with Lisinopril is on hold. Hydralazine added IV for treatment. Consider clonidine patch if control worsens.  Chronic diastolic heart failure. Remains stable.   Anemia, likely secondary to chronic disease. Stable. Plan is to monitor with no further diagnostic workup or intervention given her age and chronic debility.  Dementia, likely Alzheimer's versus vascular dementia. Rather advanced. Unable to contact family to date. See Social worker note. DSS and CSW supervisor consulted. Reportedly family has had no contact with patient for quite a while and [facility]. Attempted to call daughter and unable to leave voice mail as service not set up. Left message for Josie SaundersGerard to call back. In the meantime, given advanced age, advanced dementia a CODE STATUS of DNR was appropriate and indicated. She does not appear to be a candidate for a PEG tube. She appears to be too confused to take any medications, liquids, or solids by mouth. She appears to be hospice ready.  Procedures:  none  Consultations:  none  Discharge Exam: Filed Vitals:   12/19/13 0847  BP: 123/40  Pulse: 76  Temp:   Resp:     General: somewhat frail in NAD Cardiovascular: RRR +murmur no LE edema Respiratory: normal effort BS distant but clear  Discharge Instructions You were cared for by a hospitalist during your hospital stay. If you have any questions about your discharge medications or the care you received while you were in the hospital after you are discharged, you can call the unit and asked to speak with the hospitalist  on call if the hospitalist that took care of you is not available. Once you are discharged, your primary care physician will handle any further medical issues. Please note that NO REFILLS for any discharge  medications will be authorized once you are discharged, as it is imperative that you return to your primary care physician (or establish a relationship with a primary care physician if you do not have one) for your aftercare needs so that they can reassess your need for medications and monitor your lab values.  Discharge Instructions    Diet - low sodium heart healthy    Complete by:  As directed      Discharge instructions    Complete by:  As directed   Take medication as directed     Increase activity slowly    Complete by:  As directed           Current Discharge Medication List    START taking these medications   Details  levofloxacin (LEVAQUIN) 500 MG tablet Take 1 tablet (500 mg total) by mouth daily. Qty: 2 tablet, Refills: 0      CONTINUE these medications which have NOT CHANGED   Details  budesonide-formoterol (SYMBICORT) 160-4.5 MCG/ACT inhaler Inhale 2 puffs into the lungs 2 (two) times daily.    Cholecalciferol 2000 UNITS TABS Take 1 tablet by mouth daily.    guaiFENesin (MUCINEX) 600 MG 12 hr tablet Take 600 mg by mouth 2 (two) times daily.    insulin aspart (NOVOLOG) 100 UNIT/ML injection Inject 10 Units into the skin stat.     ipratropium-albuterol (DUONEB) 0.5-2.5 (3) MG/3ML SOLN Take 3 mLs by nebulization every 6 (six) hours as needed. Shortness of breath    Multiple Vitamin (MULITIVITAMIN WITH MINERALS) TABS Take 1 tablet by mouth daily.    omeprazole (PRILOSEC) 20 MG capsule Take 20 mg by mouth daily.    Oyster Shell (OYSTER CALCIUM) 500 MG TABS tablet Take 500 mg of elemental calcium by mouth 2 (two) times daily.    tiotropium (SPIRIVA) 18 MCG inhalation capsule Place 18 mcg into inhaler and inhale daily.    acetaminophen (TYLENOL) 325 MG tablet Take 650 mg by mouth every 4 (four) hours as needed. Pain/fever    acidophilus (RISAQUAD) CAPS Take 1 capsule by mouth 2 (two) times daily. 14 day course, should be completed by 05/17/11     Fluticasone-Salmeterol (ADVAIR) 100-50 MCG/DOSE AEPB Inhale 1 puff into the lungs every 12 (twelve) hours.    HYDROcodone-acetaminophen (NORCO) 5-325 MG per tablet Take 1 tablet by mouth 2 (two) times daily as needed. Moderate to severe pain    levothyroxine (SYNTHROID, LEVOTHROID) 75 MCG tablet Take 125 mcg by mouth daily.     lisinopril (PRINIVIL,ZESTRIL) 10 MG tablet Take 10 mg by mouth daily. Hold for systolic BP <105    polyethylene glycol (MIRALAX / GLYCOLAX) packet Take 17 g by mouth daily.       Allergies  Allergen Reactions  . Latex   . Penicillins       The results of significant diagnostics from this hospitalization (including imaging, microbiology, ancillary and laboratory) are listed below for reference.    Significant Diagnostic Studies: Dg Chest Portable 1 View  12/14/2013   CLINICAL DATA:  Cough for 2 days initial evaluation  EXAM: PORTABLE CHEST - 1 VIEW  COMPARISON:  05/14/2011  FINDINGS: Heart size normal. Mild central vascular congestion. Moderate diffuse interstitial prominence similar to prior study. No consolidation or effusion. Aortic arch calcifications stable.  IMPRESSION: Similar to prior study there is increased interstitial opacity bilaterally, slightly worse. In the setting of accompanying mild vascular congestion the findings are concerning for interstitial pulmonary edema.   Electronically Signed   By: Esperanza Heir M.D.   On: 12/14/2013 20:18   Dg Abd Acute W/chest  12/17/2013   CLINICAL DATA:  80 year old diabetic hypertensive female with generalized abdominal pain. Subsequent encounter.  EXAM: ACUTE ABDOMEN SERIES (ABDOMEN 2 VIEW & CHEST 1 VIEW)  COMPARISON:  12/14/2013 09/04/2011 chest x-ray.  FINDINGS: Cardiomegaly. Pulmonary vascular prominence superimposed upon chronic lung changes. No segmental consolidation. Crowding of lung markings felt to be responsible for opacity along peripheral aspect right mid lung.  Calcified mildly tortuous aorta.   Nonspecific bowel gas pattern without plain film evidence of bowel obstruction or free intraperitoneal air.  Gallstones suspected.  Prior right hip surgery.  Scoliosis lumbar spine.  IMPRESSION: Nonspecific bowel gas pattern without plain film evidence of bowel obstruction or free intraperitoneal air.  Gallstones suspected.  Pulmonary vascular prominence superimposed upon chronic lung changes.  Cardiomegaly.  Calcified slightly tortuous aorta.   Electronically Signed   By: Bridgett Larsson M.D.   On: 12/17/2013 11:53    Microbiology: Recent Results (from the past 240 hour(s))  Blood culture (routine x 2)     Status: None   Collection Time: 12/14/13  9:45 PM  Result Value Ref Range Status   Specimen Description BLOOD RIGHT ARM  Final   Special Requests BOTTLES DRAWN AEROBIC ONLY 4CC  Final   Culture  Setup Time   Final    12/17/2013 00:17 Performed at [facility]    Culture   Final    STAPHYLOCOCCUS SPECIES (COAGULASE NEGATIVE) Note: THE SIGNIFICANCE OF ISOLATING THIS ORGANISM FROM A SINGLE VENIPUNCTURE CANNOT BE PREDICTED WITHOUT FURTHER CLINICAL AND CULTURE CORRELATION. SUSCEPTIBILITIES AVAILABLE ONLY ON REQUEST. Note: Gram Stain Report Called to,Read Back By and Verified With: JAMES T.@0720AM  ON 12/16/13 BY THOMPSON S. Performed at [facility] Performed at [facility]    Report Status 12/17/2013 FINAL  Final  MRSA PCR Screening     Status: Abnormal   Collection Time: 12/15/13  1:46 AM  Result Value Ref Range Status   MRSA by PCR POSITIVE (A) NEGATIVE Final    Comment:        The GeneXpert MRSA Assay (FDA approved for NASAL specimens only), is one component of a comprehensive MRSA colonization surveillance program. It is not intended to diagnose MRSA infection nor to guide or monitor treatment for MRSA infections. RESULT CALLED TO, READ BACK BY AND VERIFIED WITH:  JOHNSON,B @ 0428 ON 12/15/13 BY WOODIE,J   Clostridium Difficile by PCR     Status: None    Collection Time: 12/18/13  5:36 PM  Result Value Ref Range Status   C difficile by pcr NEGATIVE NEGATIVE Final     Labs: Basic Metabolic Panel:  Recent Labs Lab 12/15/13 0605 12/16/13 0554 12/17/13 0535 12/18/13 0825 12/19/13 0539  NA 156* 158* 148* 136* 139  K 2.8* 3.9 4.2 4.8 4.8  CL 116* 121* 114* 104 107  CO2 27 24 24 20 20   GLUCOSE 261* 218* 286* 287* 83  BUN 54* 47* 38* 31* 28*  CREATININE 0.97 0.98 1.05 1.06 1.13*  CALCIUM 10.1 9.7 9.0 8.6 8.9  MG 2.1  --   --   --   --    Liver Function Tests: No results for input(s): AST, ALT, ALKPHOS, BILITOT, PROT, ALBUMIN in the last  168 hours. No results for input(s): LIPASE, AMYLASE in the last 168 hours. No results for input(s): AMMONIA in the last 168 hours. CBC:  Recent Labs Lab 12/14/13 1933 12/15/13 0605 12/16/13 0554 12/17/13 0535 12/18/13 0825 12/19/13 0539  WBC 23.8* 22.9* 19.6* 16.3* 16.0* 17.3*  NEUTROABS 21.7*  --   --   --   --   --   HGB 12.4 11.5* 11.6* 10.3* 11.2* 11.2*  HCT 38.5 35.5* 36.5 32.4* 35.1* 35.1*  MCV 100.8* 100.3* 101.4* 100.3* 99.2 98.3  PLT 201 311 282 276 257 350   Cardiac Enzymes:  Recent Labs Lab 12/14/13 1933  TROPONINI <0.30   BNP: BNP (last 3 results) No results for input(s): PROBNP in the last 8760 hours. CBG:  Recent Labs Lab 12/18/13 2025 12/19/13 0104 12/19/13 0420 12/19/13 0800 12/19/13 1139  GLUCAP 177* 154* 98 126* 89       Signed:  BLACK,KAREN M  [facility] 12/19/2013, 2:09 PM

## 2013-12-19 NOTE — Progress Notes (Signed)
[facility] Marcial PacasJean Weber.

## 2013-12-19 NOTE — Plan of Care (Signed)
Problem: Discharge Progression Outcomes Goal: Discharge plan in place and appropriate Outcome: Completed/Met Date Met:  12/19/13 Goal: Pain controlled with appropriate interventions Outcome: Completed/Met Date Met:  12/19/13 Goal: Hemodynamically stable Outcome: Completed/Met Date Met:  12/19/13 Goal: Complications resolved/controlled Outcome: Completed/Met Date Met:  12/19/13 To [facility] Goal: Tolerating diet Outcome: Not Met (add Reason) Pt is unable to swallow Goal: Activity appropriate for discharge plan Outcome: Not Applicable Date Met:  12/19/13 To [facility]  Goal: Other Discharge Outcomes/Goals Outcome: Not Applicable Date Met:  12/19/13     

## 2013-12-20 ENCOUNTER — Other Ambulatory Visit: Payer: Self-pay | Admitting: *Deleted

## 2013-12-20 MED ORDER — HYDROCODONE-ACETAMINOPHEN 5-325 MG PO TABS
ORAL_TABLET | ORAL | Status: DC
Start: 2013-12-20 — End: 2014-04-10

## 2013-12-20 NOTE — Telephone Encounter (Signed)
[facility] 

## 2013-12-20 NOTE — Care Management Utilization Note (Signed)
UR review complete.  

## 2013-12-22 ENCOUNTER — Non-Acute Institutional Stay (SKILLED_NURSING_FACILITY): Payer: Medicare Other | Admitting: Internal Medicine

## 2013-12-22 DIAGNOSIS — J849 Interstitial pulmonary disease, unspecified: Secondary | ICD-10-CM

## 2013-12-22 DIAGNOSIS — J189 Pneumonia, unspecified organism: Secondary | ICD-10-CM

## 2013-12-22 DIAGNOSIS — E870 Hyperosmolality and hypernatremia: Secondary | ICD-10-CM

## 2013-12-24 NOTE — Progress Notes (Addendum)
Patient ID: Christina Douglas, female   DOB: 13-May-1913, 80 y.o.   MRN: [id number]               HISTORY & PHYSICAL  DATE:  12/22/2013    [facility]    LEVEL OF CARE:   SNF   CHIEF COMPLAINT:  [facility] from 12/14/2013 through 12/19/2013.    HISTORY OF PRESENT ILLNESS:  [name] is a 80 year-old woman with severe dementia who was sent to the ER for apparent cough without fever.  She had not been eating well.  She was found to have severe dehydration with hypernatremia.  Her sodium on admission was 156, increased to 159 into the hospitalization.    She was felt to have hospital-acquired pneumonia, although her chest x-ray revealed no infiltrate.  She had a cough and severe leukocytosis.  She was treated with IV antibiotics and transitioned to Levaquin.  Although I received some information that she was impacted, I do not see evidence of this.    Her lisinopril was put on hold and she was given IV hydralazine in the hospital.    PAST MEDICAL HISTORY/PROBLEM LIST:    Type 2 diabetes, although she has not been on anything for this recently.    Hypothyroidism.  On replacement.     Treated for aspiration pneumonia in June of this year.    History of COPD.    History of palpable abdominal aorta, ?aortic aneurysm.    Anemia of chronic disease.    Chronic diastolic heart failure.    Blood glucose was over 400 at admission.  Hemoglobin A1c was listed at 6.1 on 10/16/2013.   That was, I think, in our records.    CURRENT MEDICATIONS:  Discharge medications include:     Symbicort 160/4.5, 2 puffs b.i.d.    Vitamin D 2000 once daily.    Mucinex 600 b.i.d.    DuoNebs p.r.n.    Prilosec 20 q.d.    Calcium 500 b.i.d.    Spiriva 18 q.d.    Advair 100/50 q.12.    Synthroid 125 daily.  TSH was normal in the hospital.     Lisinopril 10 q.d.    Levaquin 500 q.d. for five days.    MiraLAX 17 g once daily.    Norco p.r.n.    SOCIAL  HISTORY:   ADVANCED DIRECTIVES:  The patient has no advanced directives on her chart.    REVIEW OF SYSTEMS:  Not possible due to dementia.    PHYSICAL EXAMINATION:   VITAL SIGNS:   O2 SATURATIONS:  96% on room air.   PULSE:   86.   RESPIRATIONS:  20.   GENERAL APPEARANCE:  Very frail, elderly woman.   CHEST/RESPIRATORY:  Bronchial breathing with crackles in the bilateral lower lobes.   CARDIOVASCULAR:  CARDIAC:   Heart sounds are normal.  Possibly slightly volume-contracted.   GASTROINTESTINAL:  ABDOMEN:   Diffusely tender, but no guarding or rebound.  No masses.  Bowel sounds are positive.   LIVER/SPLEEN/KIDNEYS:  No liver, no spleen.   GENITOURINARY:  BLADDER:   Not distended.  There is no CVA tenderness.   MUSCULOSKELETAL:   EXTREMITIES:   BILATERAL LOWER EXTREMITIES:  Significant osteoarthritis of both knees.  Perhaps some mild erythema over the right knee with tenderness.  This probably should be followed.   NEUROLOGICAL:  Extremities:  Plantar flexion contractures?     ASSESSMENT/PLAN:     Severe hypernatremia.  This  will need to be followed.    Elevated white count.  Probably aspiration pneumonitis.  This is not the first time she has done this.  She was treated for a similar pulmonary presentation in June of this year.    Hypothyroidism.  On replacement.   TSH was normal.    COPD.  I cannot see this woman easily doing inhalers, although she was on Spiriva and Symbicort before she went out.  Also noted to have interstitial lung disease on her problem list  Type 2 diabetes.  She was not on anything before she went to the hospital on this occasion.  We are monitoring her CBGs.    We will need to have a social conference with the patient's power of attorney.  The hypernatremia in patient's with advanced dementia is often a preclude to a preterminal phase.  She comes back on both Symbicort and Advair.  I will discontinue the Advair.  I will try to speak with the staff about  whether we are even able to get this reliably into the patient at this point.

## 2014-01-02 ENCOUNTER — Non-Acute Institutional Stay (SKILLED_NURSING_FACILITY): Payer: Medicare Other | Admitting: Internal Medicine

## 2014-01-02 DIAGNOSIS — I5032 Chronic diastolic (congestive) heart failure: Secondary | ICD-10-CM

## 2014-01-02 DIAGNOSIS — F0280 Dementia in other diseases classified elsewhere without behavioral disturbance: Secondary | ICD-10-CM

## 2014-01-02 DIAGNOSIS — IMO0002 Reserved for concepts with insufficient information to code with codable children: Secondary | ICD-10-CM

## 2014-01-02 DIAGNOSIS — E1165 Type 2 diabetes mellitus with hyperglycemia: Secondary | ICD-10-CM

## 2014-01-02 DIAGNOSIS — G309 Alzheimer's disease, unspecified: Secondary | ICD-10-CM

## 2014-01-07 NOTE — Progress Notes (Addendum)
Patient ID: Christina Douglas, female   DOB: 16-Dec-1913, 80 y.o.   MRN: [id number]               PROGRESS NOTE  DATE:  01/02/2014    [facility]    LEVEL OF CARE:   SNF   Acute Visit   CHIEF COMPLAINT:  Declining p.o. intake, failure to thrive.    HISTORY OF PRESENT ILLNESS:  This is a patient who is a longstanding resident of this facility.    She has a history of advanced dementia.    She was recently in the hospital for severe hypernatremia, aspiration pneumonitis . I readmitted her to the facility on 12/22/2013.    Since her return here, she has not been doing well.   Her oral intake is a maximum of 25% of her meal with Speech Therapy today.    I was called last week to a report that she had oral thrush.  We gave her Diflucan and Nystatin.  She would not open her mouth for me today.     LABORATORY DATA:  Lab work from 01/01/2014 showed:    Glucose was 300, a BUN of 46, a creatinine of 1.16.    White count 9.6, hemoglobin 10.3.  Differential count was fairly normal.      PHYSICAL EXAMINATION:   GENERAL APPEARANCE:  The patient really looks uncomfortable.  When I try to move her or touch her, all she does is yell.   CHEST/RESPIRATORY:  Bibasilar crackles.   CARDIOVASCULAR:  CARDIAC:   Heart sounds are normal.   She is dehydrated, at least clinically.    GASTROINTESTINAL:  ABDOMEN:   Diffusely tender.  No guarding or rebound.    ASSESSMENT/PLAN:                             Advanced dementia.   I suspect this woman is on her way to failure to thrive.  She is eating and drinking poorly.  She actually looks more dehydrated than yesterday's labs would suggest.    Type 2 diabetes.  She is a diabetic.  However, she has not been on anything for this recently.  Her blood sugars are episodically running high, even over 400 on one occasion I see.  More recently, they seem to be in the upper 100s.    COPD.  This does not appear to be that unstable.    History of  chronic diastolic heart failure.  Once again, I do not see any evidence of this.    I think this patient is probably failing.  Per discussion with the social worker today, she is her own RP.  She has a daughter but we have not been able to contact her.  The facility sent a registered letter a week ago to apparently grandchildren, looking for the daughter's location.  However, they have not received anything back.  She really needs to be made a No Code and comfort care secondary to the advanced state of her dementia.  As I noted previously, hypernatremia in this population is often a forerunner of this type of presentation with advancing failure to thrive, declining p.o. intake.

## 2014-01-10 ENCOUNTER — Other Ambulatory Visit: Payer: Self-pay | Admitting: Internal Medicine

## 2014-01-10 DIAGNOSIS — R1310 Dysphagia, unspecified: Secondary | ICD-10-CM

## 2014-01-23 ENCOUNTER — Non-Acute Institutional Stay (SKILLED_NURSING_FACILITY): Payer: Medicare Other | Admitting: Internal Medicine

## 2014-01-23 DIAGNOSIS — J189 Pneumonia, unspecified organism: Secondary | ICD-10-CM

## 2014-01-23 DIAGNOSIS — E870 Hyperosmolality and hypernatremia: Secondary | ICD-10-CM

## 2014-01-28 NOTE — Progress Notes (Addendum)
Patient ID: Christina Douglas, female   DOB: [birth year] Douglas, 80 y.o.   MRN: [id number]               PROGRESS NOTE  DATE:  01/23/2014      [facility]    LEVEL OF CARE:   SNF   Acute Visit   CHIEF COMPLAINT:  Follow up weight loss, failure to thrive.    HISTORY OF PRESENT ILLNESS:  [name] was recently in hospital in November for severe hypernatremia and aspiration pneumonitis.  I readmitted her to the facility on 12/22/2013.  She initially did not do well.  She was not eating.  Oral intake was less than 25% of her meals.    Since that time, she appears to have had a miraculous turn-around.  She is eating reasonably well and has actually put on weight, 2 pounds per description of the staff.    As I see her today, she is sitting in the hall of the facility eating candy.  She drank a glass of water for me.  She is alert, looks well hydrated and, as mentioned, her weight has improved.    There have been requests from multiple sources in the facility for her to undergo a modified barium swallow.  While I have no doubt that she is aspirating, I have been somewhat resistant to this as the patient appears to be doing well with her current diet and this is really a remarkable recovery.  I really do not particularly want to "rock the boat".    LABORATORY DATA:  Last lab work on 12/24/2013 showed:    BUN was 34, creatinine of 1.14.  Her sodium was 139.    White count was 10.4, hemoglobin of 10.    PHYSICAL EXAMINATION:   GENERAL APPEARANCE:  The patient is awake, alert, conversational.   CHEST/RESPIRATORY:  She has bibasilar crackles.   CARDIOVASCULAR:  CARDIAC:  She appears to be euvolemic.  There are no signs of heart failure.    ASSESSMENT/PLAN:                          Aspiration pneumonitis, hypernatremia in the last month.  I really did not expect this patient to survive when she came back to the facility or do as well as she appears to be doing.  While I do think that  she is probably aspirating, I have not been sure how we would use the information provided by a barium swallow.

## 2014-02-04 ENCOUNTER — Inpatient Hospital Stay (HOSPITAL_COMMUNITY): Admission: RE | Admit: 2014-02-04 | Payer: Medicare Other | Source: Ambulatory Visit

## 2014-02-04 ENCOUNTER — Ambulatory Visit (HOSPITAL_COMMUNITY): Payer: Medicare Other | Attending: Internal Medicine | Admitting: Speech Pathology

## 2014-02-07 ENCOUNTER — Telehealth: Payer: Self-pay | Admitting: Nurse Practitioner

## 2014-02-07 ENCOUNTER — Encounter: Payer: Self-pay | Admitting: Nurse Practitioner

## 2014-02-07 ENCOUNTER — Ambulatory Visit (INDEPENDENT_AMBULATORY_CARE_PROVIDER_SITE_OTHER): Payer: Medicare Other | Admitting: Nurse Practitioner

## 2014-02-07 VITALS — BP 140/70 | HR 82 | Temp 97.0°F | Ht 64.0 in

## 2014-02-07 DIAGNOSIS — Z789 Other specified health status: Secondary | ICD-10-CM

## 2014-02-07 DIAGNOSIS — Z87898 Personal history of other specified conditions: Secondary | ICD-10-CM | POA: Insufficient documentation

## 2014-02-07 DIAGNOSIS — R634 Abnormal weight loss: Secondary | ICD-10-CM

## 2014-02-07 DIAGNOSIS — K219 Gastro-esophageal reflux disease without esophagitis: Secondary | ICD-10-CM

## 2014-02-07 NOTE — Progress Notes (Signed)
Primary Care PhysiciTerald SleeperEL GAVIN, MD Primary Gastroenterologist:  Dr.  Jena Gauss  Chief Complaint  Patient presents with  . Weight Loss  . Gastrophageal Reflux    HPI:   81 year old female presents on referral 12/25/13 from [facility] for "poor appetite, decreased po intake, audible reflux, and 6lb weight loss in 1 week." Per attending/PCP note  On 01/23/14 patient was recently admitted in November 2015 for hyponatremia and aspiration pneumonitis, readmitted to [facility] 12/22/13. Initially did not do well with poor intake (<25%) and was not expected to survive. After that point she made a "miraculous turn-around" and began improving intake and put on 2 pounds in the week prior to 01/23/14. Facility had been requesting barium swallow but attending reluctant due to current improvement and not wanting to "rock the boat." He admits that she is probably aspirating but unsure of what a barium swallow could offer. Patient is currently on Prilosec  twice daily for GERD. Is also currently receiving Resource supplement four times a day. No record of weights provided by the facility.  Patient today is accompanied by a staff member from the facility. Patient with Alzheimers and poor historian, facility staff member unsure of many details. Per her the patient complains to the nurses that it hurts to swallow and she doesn't want to eat. Also c/o GERD symptoms. She is unsure of what her weight status has been over the past couple weeks. Occasionally vomits after eating. No blood in her emesis per the staff. No other information available. Attempts to contact facility for further information unsuccessful, will request records.    Past Medical History  Diagnosis Date  . Diabetes mellitus   . Muscle weakness (generalized)   . Diastolic heart failure   . Hypertension   . Thyroid disease   . Kidney injury   . Dementia   . CHF (congestive heart failure)   . Anemia   .  Hypothyroid   . Interstitial lung disorders   . Hyperlipidemia     Past Surgical History  Procedure Laterality Date  . Cataract extraction    . Tonsillectomy    . Total hip arthroplasty      Current Outpatient Prescriptions  Medication Sig Dispense Refill  . acetaminophen (TYLENOL) 325 MG tablet Take 650 mg by mouth every 4 (four) hours as needed. Pain/fever    . budesonide-formoterol (SYMBICORT) 160-4.5 MCG/ACT inhaler Inhale 2 puffs into the lungs 2 (two) times daily.    . Calcium-Magnesium-Vitamin D (CALCIUM 500 PO) Take 1 capsule by mouth 2 (two) times daily.    . Cholecalciferol 2000 UNITS TABS Take 1 tablet by mouth daily.    . Fluticasone-Salmeterol (ADVAIR) 100-50 MCG/DOSE AEPB Inhale 1 puff into the lungs every 12 (twelve) hours.    Marland Kitchen guaiFENesin (MUCINEX) 600 MG 12 hr tablet Take 600 mg by mouth 2 (two) times daily.    Marland Kitchen HYDROcodone-acetaminophen (NORCO/VICODIN) 5-325 MG per tablet Take one tablet by mouth twice daily as needed for moderate to severe pain 60 tablet 0  . insulin aspart (NOVOLOG) 100 UNIT/ML injection Inject 10 Units into the skin stat. Sliding scale    . ipratropium-albuterol (DUONEB) 0.5-2.5 (3) MG/3ML SOLN Take 3 mLs by nebulization every 6 (six) hours as needed. Shortness of breath    . levofloxacin (LEVAQUIN) 500 MG tablet Take 1 tablet (500 mg total) by mouth daily. 2 tablet 0  . levothyroxine (SYNTHROID, LEVOTHROID) 75 MCG tablet Take 125 mcg by mouth daily.     Marland Kitchen  lisinopril (PRINIVIL,ZESTRIL) 10 MG tablet Take 10 mg by mouth daily. Hold for systolic BP <105    . Multiple Vitamin (MULITIVITAMIN WITH MINERALS) TABS Take 1 tablet by mouth daily.    . Multiple Vitamins-Minerals (CERTA-VITE PO) Take 1 tablet by mouth.    . Nutritional Supplements (RESOURCE 2.0) LIQD Take 60 mLs by mouth 4 (four) times daily.    Marland Kitchen. omeprazole (PRILOSEC) 20 MG capsule Take 20 mg by mouth daily.    Ethelda Chick. Oyster Shell (OYSTER CALCIUM) 500 MG TABS tablet Take 500 mg of elemental  calcium by mouth 2 (two) times daily.    . polyethylene glycol (MIRALAX / GLYCOLAX) packet Take 17 g by mouth daily.    Marland Kitchen. tiotropium (SPIRIVA) 18 MCG inhalation capsule Place 18 mcg into inhaler and inhale daily.    Marland Kitchen. acidophilus (RISAQUAD) CAPS Take 1 capsule by mouth 2 (two) times daily. 14 day course, should be completed by 05/17/11     No current facility-administered medications for this visit.    Allergies as of 02/07/2014 - Review Complete 02/07/2014  Allergen Reaction Noted  . Latex  12/16/2013  . Penicillins  05/14/2011    No family history on file.  History   Social History  . Marital Status: Widowed    Spouse Name: N/A    Number of Children: N/A  . Years of Education: N/A   Occupational History  . Not on file.   Social History Main Topics  . Smoking status: Never Smoker   . Smokeless tobacco: Never Used  . Alcohol Use: No  . Drug Use: No  . Sexual Activity: No   Other Topics Concern  . Not on file   Social History Narrative    Review of Systems: Unable to complete due to dementia and not answering questions appropriately.  Physical Exam: BP 140/70 mmHg  Pulse 82  Temp(Src) 97 F (36.1 C)  Ht 5\' 4"  (1.626 m) General:   Alert. Pleasant and cooperative. Wheelchair bound. Appears thin and fragile.  Head:  Normocephalic and atraumatic. Eyes:  Without icterus, sclera clear and conjunctiva pink.  Ears:  Decreased hearing ability.. Mouth:  No deformity or lesions, oral mucosa pink.  Neck:  Supple, without mass or thyromegaly. Lungs:  Slight bibasilar crackles noted. No wheezes, rales, or rhonchi. No distress.  Heart:  S1, S2 present without murmurs appreciated.  Abdomen:  +BS, soft, non-tender and non-distended. No HSM noted. No guarding or rebound. No masses appreciated. Limited due to patient in wheelchair and unable to get to exam table. Rectal:  Deferred  Extremities:  Without clubbing or edema. Neurologic:  Alert and oriented to person only. Skin:   Intact without significant lesions or rashes. Cervical Nodes:  No significant cervical adenopathy. Psych:  Alert and cooperative. Normal mood and affect.     02/07/2014 9:54 AM

## 2014-02-07 NOTE — Telephone Encounter (Signed)
Please contact patient facility [facility], [phone]) and request patient's recent weights, current diet, any aspiration precautions, and any recent speech therapy consults/notes.

## 2014-02-07 NOTE — Assessment & Plan Note (Signed)
Likely multifactorial. Last impression from PCP/Attending note end of December is patient appeared to be gaining weight and improving intake again. Will request records to include current weights and make any needed further recommendations at the next visit.

## 2014-02-07 NOTE — Assessment & Plan Note (Signed)
Patient with probably GERD symptoms based on limited history, not well controlled on current PPI therapy. Will increase Prilosec to 40mg  bid and re-evaluate her in 6 weeks for effectiveness.

## 2014-02-07 NOTE — Assessment & Plan Note (Signed)
Admitted with aspiration pneumonitis and per PCP/Attending patient likely continues to aspirate. Unable to get information on current precautions, current diet, and weights from facility. Recommend aspiration precautions and speech therapy consult for to evaluate for needed diet changes and further recommended for workup on aspiration risk, possible barium swallow to evaluate for stricture, dysmotility, etc. Will follow-up in 6 weeks to reassess patient. At this point given patient recent complications and hospitalization as well as fragile state, am hesitant to recommend invasive endoscopy with sedation.

## 2014-02-07 NOTE — Patient Instructions (Signed)
1. Increase Prilosec to 40mg  bid 2. Speech therapy evaluation for recommendations on precautions, diet changes, and if further eval (such as barium swallow, etc) are warranted. 3. Follow-up here in 6 weeks.

## 2014-02-07 NOTE — Telephone Encounter (Signed)
Spoke with pts nurse Marcial PacasJean Weber and requested additonal information. States she will fax over records

## 2014-03-26 ENCOUNTER — Ambulatory Visit: Payer: Medicare Other | Admitting: Gastroenterology

## 2014-04-10 ENCOUNTER — Ambulatory Visit (INDEPENDENT_AMBULATORY_CARE_PROVIDER_SITE_OTHER): Payer: Medicare Other | Admitting: Gastroenterology

## 2014-04-10 ENCOUNTER — Encounter: Payer: Self-pay | Admitting: Gastroenterology

## 2014-04-10 VITALS — BP 138/58 | HR 63 | Temp 97.6°F | Wt 128.0 lb

## 2014-04-10 DIAGNOSIS — Z87898 Personal history of other specified conditions: Secondary | ICD-10-CM

## 2014-04-10 DIAGNOSIS — R634 Abnormal weight loss: Secondary | ICD-10-CM

## 2014-04-10 DIAGNOSIS — Z789 Other specified health status: Secondary | ICD-10-CM

## 2014-04-10 DIAGNOSIS — K219 Gastro-esophageal reflux disease without esophagitis: Secondary | ICD-10-CM | POA: Diagnosis not present

## 2014-04-10 MED ORDER — OMEPRAZOLE 20 MG PO CPDR
20.0000 mg | DELAYED_RELEASE_CAPSULE | Freq: Two times a day (BID) | ORAL | Status: AC
Start: 2014-04-10 — End: ?

## 2014-04-10 NOTE — Assessment & Plan Note (Signed)
Requested speech therapy consult records for review. Sounds like patient is on a modified diet.

## 2014-04-10 NOTE — Progress Notes (Addendum)
Reviewed available weights. 12/12/2013 she weighed 128 pounds. Over the past 12 months her highest weight was 132 pounds.   November 26 she weighed 119 pounds. She's had a gradual weight increase up until January 7 with a weight of 128 and remained stable for the past 2 months.  Await speech therapy recommendations records.   Overall, appears that patient has returned to her baseline.

## 2014-04-10 NOTE — Assessment & Plan Note (Signed)
Limited history. Nothing provided from nursing home facility and patient with dementia. We have requested records regarding speech therapy evaluation which was requested after our last visit and her current weights. For now, we will decrease omeprazole to 20mg  BID from 40mg  BID.

## 2014-04-10 NOTE — Progress Notes (Signed)
Primary Care Physician:  Terald Sleeper, MD  Primary Gastroenterologist:   Roetta Sessions, MD    Chief Complaint  Patient presents with  . Gastrophageal Reflux    HPI:  Christina Douglas is a 81 y.o. female here for follow-up. She is a resident of [facility] nursing and rehabilitation. She was seen on 02/07/2014 for initial consultation for "poor appetite, decreased oral intake, 6 pound weight loss in 1 week, audible reflux." Patient admitted to the hospital back in November with severe hypernatremia and hospital-acquired pneumonia according to the discharge summary. According to nursing home records and was concern for possibility of aspiration pneumonitis. She suspected to have some aspiration issues although attending had avoided testing in order to "not rock the boat". We suggested speech therapy bedside evaluation. According to the transport person today, sounds like patient may be on modified diet including thickened liquids. I have not been confirmed this at this time. Patient has baseline dementia, very pleasant and answers all my questions but reliability is on clear. She denies difficulty swallowing. She states her appetite is good. Denies heartburn or abdominal pain. Denies any issues with her bowel habits.  We have requested further records of weights and speech therapy evaluation. It appears she weighed 128 pounds on 04/01/14 at the nursing home.   Current Outpatient Prescriptions  Medication Sig Dispense Refill  . acetaminophen (TYLENOL) 325 MG tablet Take 650 mg by mouth every 4 (four) hours as needed. Pain/fever    . acidophilus (RISAQUAD) CAPS Take 1 capsule by mouth 2 (two) times daily. 14 day course, should be completed by 05/17/11    . budesonide-formoterol (SYMBICORT) 160-4.5 MCG/ACT inhaler Inhale 2 puffs into the lungs 2 (two) times daily.    . Calcium-Magnesium-Vitamin D (CALCIUM 500 PO) Take 1 capsule by mouth 2 (two) times daily.    . Cholecalciferol 2000 UNITS TABS  Take 1 tablet by mouth daily.    . Fluticasone-Salmeterol (ADVAIR) 100-50 MCG/DOSE AEPB Inhale 1 puff into the lungs every 12 (twelve) hours.    Marland Kitchen guaiFENesin (MUCINEX) 600 MG 12 hr tablet Take 600 mg by mouth 2 (two) times daily.    . insulin aspart (NOVOLOG) 100 UNIT/ML injection Inject 10 Units into the skin stat. Sliding scale    . ipratropium-albuterol (DUONEB) 0.5-2.5 (3) MG/3ML SOLN Take 3 mLs by nebulization every 6 (six) hours as needed. Shortness of breath    . levothyroxine (SYNTHROID, LEVOTHROID) 75 MCG tablet Take 125 mcg by mouth daily.     Marland Kitchen lisinopril (PRINIVIL,ZESTRIL) 10 MG tablet Take 10 mg by mouth daily. Hold for systolic BP <105    . Multiple Vitamin (MULITIVITAMIN WITH MINERALS) TABS Take 1 tablet by mouth daily.    . Multiple Vitamins-Minerals (CERTA-VITE PO) Take 1 tablet by mouth.    . Nutritional Supplements (RESOURCE 2.0) LIQD Take 60 mLs by mouth 4 (four) times daily.    Marland Kitchen omeprazole (PRILOSEC) 40 MG capsule Take 40 mg by mouth BID.    Marland Kitchen Oyster Shell (OYSTER CALCIUM) 500 MG TABS tablet Take 500 mg of elemental calcium by mouth 2 (two) times daily.    . polyethylene glycol (MIRALAX / GLYCOLAX) packet Take 17 g by mouth daily.    Marland Kitchen tiotropium (SPIRIVA) 18 MCG inhalation capsule Place 18 mcg into inhaler and inhale daily.    Marland Kitchen HYDROcodone-acetaminophen (NORCO/VICODIN) 5-325 MG per tablet Take one tablet by mouth twice daily as needed for moderate to severe pain (Patient not taking: Reported on 04/10/2014) 60 tablet 0  .  No current facility-administered medications for this visit.    Allergies as of 04/10/2014 - Review Complete 04/10/2014  Allergen Reaction Noted  . Latex  12/16/2013  . Penicillins  05/14/2011    Past Medical History  Diagnosis Date  . Diabetes mellitus   . Muscle weakness (generalized)   . Diastolic heart failure   . Hypertension   . Thyroid disease   . Kidney injury   . Dementia   . CHF (congestive heart failure)   . Anemia   .  Hypothyroid   . Interstitial lung disorders   . Hyperlipidemia     Past Surgical History  Procedure Laterality Date  . Cataract extraction    . Tonsillectomy    . Total hip arthroplasty      No family history on file. patient unable to provide.  History   Social History  . Marital Status: Widowed    Spouse Name: N/A  . Number of Children: N/A  . Years of Education: N/A   Occupational History  . Not on file.   Social History Main Topics  . Smoking status: Never Smoker   . Smokeless tobacco: Never Used  . Alcohol Use: No  . Drug Use: No  . Sexual Activity: No   Other Topics Concern  . Not on file   Social History Narrative      ROS: unreliable due to patient's dementia   Physical Examination:  BP 138/58 mmHg  Pulse 63  Temp(Src) 97.6 F (36.4 C) (Oral)  Wt 128 lb (58.06 kg)   General: Well-nourished, well-developed in no acute distress. Appears younger than stated age. Sitting in wheelchair. Head: Normocephalic, atraumatic.   Eyes: Conjunctiva pink, no icterus. Mouth: Oropharyngeal mucosa moist and pink , no lesions erythema or exudate. Neck: Supple without thyromegaly, masses, or lymphadenopathy.  Lungs: Clear to auscultation bilaterally.  Heart: Regular rate and rhythm, no murmurs rubs or gallops.  Abdomen: Bowel sounds are normal, nontender, nondistended. Limited as performed in wheelchair. Patient requires lift. Rectal: not performed.  Extremities: No lower extremity edema. No clubbing or deformities.  Neuro: alert and oriented to self, place Skin: Warm and dry, no rash or jaundice.   Psych: Alert and cooperative, normal mood and affect.  Imaging Studies: No results found.

## 2014-04-10 NOTE — Progress Notes (Signed)
[facility]

## 2014-04-10 NOTE — Progress Notes (Signed)
done

## 2014-04-10 NOTE — Patient Instructions (Signed)
1. Stop omeprazole 40 mg. Start omeprazole 20 mg twice a day before a meal. 2. Please send copy of weights from December 2015 to the present. Fax number [phone] 3. Please send copy of speech therapy evaluation to fax number [phone].

## 2014-04-10 NOTE — Assessment & Plan Note (Signed)
?  resolved. We have requested records for review.

## 2014-04-11 NOTE — Progress Notes (Signed)
cc'ed to pcp °

## 2014-05-20 ENCOUNTER — Non-Acute Institutional Stay (SKILLED_NURSING_FACILITY): Payer: Medicare Other | Admitting: Internal Medicine

## 2014-05-20 DIAGNOSIS — K219 Gastro-esophageal reflux disease without esophagitis: Secondary | ICD-10-CM

## 2014-05-20 DIAGNOSIS — E870 Hyperosmolality and hypernatremia: Secondary | ICD-10-CM

## 2014-05-20 DIAGNOSIS — I10 Essential (primary) hypertension: Secondary | ICD-10-CM

## 2014-05-20 DIAGNOSIS — G309 Alzheimer's disease, unspecified: Secondary | ICD-10-CM | POA: Diagnosis not present

## 2014-05-20 DIAGNOSIS — F0280 Dementia in other diseases classified elsewhere without behavioral disturbance: Secondary | ICD-10-CM

## 2014-05-24 NOTE — Progress Notes (Addendum)
Patient ID: Christina Douglas, female   DOB: 1913/06/15, 81 y.o.   MRN: [id number]                PROGRESS NOTE  DATE:  05/20/2014          FACILITY: [facility]       LEVEL OF CARE:   SNF   Routine Visit                     CHIEF COMPLAINT:  Review of general medical issues.      HISTORY OF PRESENT ILLNESS:  Christina Douglas is a patient with severe dementia.     She was admitted to hospital in November, at which time she had a sodium of 158.  She was treated for pneumonia.    When she returned to the facility, she looked very poorly.   I did not expect her to survive.  However, she actually rallied and has stabilized, gained some weight.    Last lab work was from January, at which time her sodium was 139.  Her albumin was 3.4.   I will need to check the recent facility weights.    When she returned, there was a lot of pressure generated to have her go for a swallowing study.   However, I really did not see the benefit to this.  For some reason that I cannot really follow, she was sent to GI and they saw her on two occasions, the last time in March.  There has been some alteration in her proton pump inhibitors, currently on 20 mg b.i.d.      Most recently, she has developed a pressure ulcer on her right heel.  This has some drainage and I saw this today, as well.    CURRENT MEDICATIONS:  Medication list is reviewed.    PHYSICAL EXAMINATION:   GENERAL APPEARANCE:  The patient is awake and verbal.   CHEST/RESPIRATORY:  Clear air entry bilaterally.    CARDIOVASCULAR:   CARDIAC:  She does not look grossly dehydrated at the bedside.   GASTROINTESTINAL:   ABDOMEN:  No masses.     LIVER/SPLEEN/KIDNEYS:  No liver, no spleen.  No tenderness.     GENITOURINARY:   BLADDER:  Not distended.   SKIN:   INSPECTION:  Right heel:  There is a shallow ulcer here.  I am actually surprised at the amount of drainage that would come out of this.  The base of this appears clean.   There is no overt  infection.    ASSESSMENT/PLAN:                   Severe dementia with a history of weight loss and dehydration.  The patient seems to have largely stabilized.  I will recheck her lab work.    Aspiration associated with severe dementia.  I see no particular need to consider doing barium swallows, endoscopies, etc.    Hypertension.  She is on Lisinopril 10 mg.    History of COPD.  On Symbicort 160/4.5, 2 puffs twice daily.    Hypothyroidism.  On replacement, Synthroid 125 mcg.  I will recheck this, as well.    As far as I can tell, this patient has stabilized.  Her weight has stabilized.  She does not appear to be dehydrated.      ADDENDUM:  I have reviewed all of her weights.  Her weight on return to the building was 122.  She got  as high as 128 on 02/07/2014.  Most recently, she was 125.  This has been mostly stable.  I have no plans to further work this woman up or consider her for aggressive nutritional support, even if and when this becomes necessary.  I see no benefit to alteration in her diet.

## 2014-05-27 ENCOUNTER — Non-Acute Institutional Stay (SKILLED_NURSING_FACILITY): Payer: Medicare Other | Admitting: Internal Medicine

## 2014-05-27 DIAGNOSIS — Z22322 Carrier or suspected carrier of Methicillin resistant Staphylococcus aureus: Secondary | ICD-10-CM

## 2014-05-27 DIAGNOSIS — L97412 Non-pressure chronic ulcer of right heel and midfoot with fat layer exposed: Secondary | ICD-10-CM

## 2014-05-29 NOTE — Progress Notes (Signed)
Patient ID: Caffie Pintorene M Mcgriff, female   DOB: 08-16-1913, 81 y.o.   MRN: [id number]                PROGRESS NOTE  DATE:  05/27/2014          [facility]               LEVEL OF CARE:   SNF   Acute Visit                  CHIEF COMPLAINT:  Follow up right heel ulcer.    HISTORY OF PRESENT ILLNESS:  I saw this area a week or two ago, at which time this was a clean wound.  However, the nurses reported a fair amount of drainage.  This only opened 10 days ago, per the records.  A culture of this has come back showing MRSA.  She was started on Septra.    PHYSICAL EXAMINATION:   SKIN:   INSPECTION:  Right foot:  Her peripheral pulses are reduced in the foot, but I think palpable.  This does not appear to have a predominant ischemic issue.  The wound is on the heel right over the weightbearing surface.  This is a small wound, but certainly looks worse than when I saw this a week ago.  There is a copious amount of purulent drainage coming from this small wound.    ASSESSMENT/PLAN:           Pressure ulcer, right heel, associated with MRSA cellulitis/infection.  This has only been for the last 10 days.  Therefore, I doubt that there is underlying serious soft tissue or bone infection.  Given the deterioration and copious amounts of drainage, I am going to change her to linezolid.  The dressing will be silver alginate.        CPT CODE: [id number]

## 2014-06-26 ENCOUNTER — Non-Acute Institutional Stay (SKILLED_NURSING_FACILITY): Payer: Medicare Other | Admitting: Internal Medicine

## 2014-06-26 DIAGNOSIS — Z22322 Carrier or suspected carrier of Methicillin resistant Staphylococcus aureus: Secondary | ICD-10-CM | POA: Diagnosis not present

## 2014-06-26 DIAGNOSIS — L97412 Non-pressure chronic ulcer of right heel and midfoot with fat layer exposed: Secondary | ICD-10-CM | POA: Diagnosis not present

## 2014-07-02 NOTE — Progress Notes (Addendum)
Patient ID: Caffie Pintorene M Vanpelt, female   DOB: Feb 25, 1913, 81 y.o.   MRN: [id number]                PROGRESS NOTE  DATE:  06/26/2014           [facility]                     LEVEL OF CARE:   SNF   Acute Visit                      CHIEF COMPLAINT:  Follow up right heel wound, other issues.           HISTORY OF PRESENT ILLNESS:  [name] was hospitalized late in 2015 for severe hypernatremia, aspiration pneumonitis, and failure to thrive.  When she first came back to the facility in late November, she was not eating.  Oral intake was less than 25%.    Since that time, she has really had quite a miraculous turn-around.  She is eating reasonably well.  Her weight has stabilized.    Roughly a month ago, she developed a right heel ulcer.  A culture of this area came back showing MRSA.  We treated this with linezolid for coexistent cellulitis and infection.  Remarkably, this has done quite well.  The wound is considerably better.    REVIEW OF SYSTEMS:   Not possible.        PHYSICAL EXAMINATION:   GENERAL APPEARANCE:  The patient looks well.   CHEST/RESPIRATORY:  Exam is clear.        CARDIOVASCULAR:   CARDIAC:  She appears to be euvolemic.  Heart sounds are normal.  There are no murmurs.    GASTROINTESTINAL:   ABDOMEN:  Soft.  No masses are noted.   SKIN:   INSPECTION:  Right heel:  A very superficial wound remains.   The wound care nurse reports that the Aquacel AG is sticking to the wound.  I will change to silver collagen with Hydrogel under a foam-based dressing.    ASSESSMENT/PLAN:             Decubitus ulcer on the right heel.  This has done very nicely.  She did receive 10 days' worth of linezolid earlier this month.  She really appears to be making a good recovery.  I am hoping collagen will close this over.    Severe failure to thrive.  Miraculously, this turned around and she has remained stable since.  There was some suggestion that she had dysphagia.  If  this is true, she is far too frail to consider parenteral feeding, endoscopies, etc.  I have not been able to really determine any suggestion of dysphagia.

## 2014-07-03 ENCOUNTER — Non-Acute Institutional Stay (SKILLED_NURSING_FACILITY): Payer: Medicare Other | Admitting: Internal Medicine

## 2014-07-03 DIAGNOSIS — N10 Acute tubulo-interstitial nephritis: Secondary | ICD-10-CM

## 2014-07-04 NOTE — Progress Notes (Addendum)
Patient ID: Caffie Pintorene M Schwalb, female   DOB: Oct 17, 1913, 81 y.o.   MRN: [id number]                PROGRESS NOTE  DATE:  07/03/2014          [facility]                      LEVEL OF CARE:   SNF   Acute Visit                    CHIEF COMPLAINT:  ?UTI.       HISTORY OF PRESENT ILLNESS:  [name] is a frail, elderly lady.  I had seen her last week for follow-up of an ulcer on her right foot, specifically her heel which actually looks some better.  We had MRSA in this, for which I gave her a course of linezolid earlier in the month.    On 06/20/2014, it was noted that she was restless, agitated, and combative.  A urine culture showed greater than 100,000 yeast and 1000 E.coli.  I did not act on the yeast when I saw this 2-3 days ago.    Today, the staff report that she had actually grossly purulent-looking urine.  She is running a low grade fever of slightly over 99.  The patient complains, "My back hurts."    PHYSICAL EXAMINATION:   GENERAL APPEARANCE:  The patient is not in any distress.   CHEST/RESPIRATORY:  Shallow, but otherwise clear air entry.      CARDIOVASCULAR:   CARDIAC:  She appears to be euvolemic.   GASTROINTESTINAL:   ABDOMEN:  No masses.     LIVER/SPLEEN/KIDNEYS:  No liver, no spleen.   GENITOURINARY:   BLADDER:  No suprapubic, but extreme CVA tenderness, especially on the right.          ASSESSMENT/PLAN:                      Pyelonephritis, acute.  I am doubtful that the 100,000 yeast could give a presentation like this.  I will probably put her on empiric quinolone and Diflucan.  The relevant finding is extreme cva tenderness on the right with the nurse describing purulent looking urine. The patient will require carefull

## 2014-07-24 ENCOUNTER — Non-Acute Institutional Stay (SKILLED_NURSING_FACILITY): Payer: Medicare Other | Admitting: Internal Medicine

## 2014-07-24 DIAGNOSIS — R8271 Bacteriuria: Secondary | ICD-10-CM

## 2014-07-24 DIAGNOSIS — N390 Urinary tract infection, site not specified: Secondary | ICD-10-CM | POA: Diagnosis not present

## 2014-07-29 NOTE — Progress Notes (Addendum)
Patient ID: Christina Douglas, female   DOB: 17-Mar-1913, 81 y.o.   MRN: [id number]                PROGRESS NOTE  DATE:  07/24/2014         [facility]AC[facility]                     LEVEL OF CARE:   SNF   Acute Visit               CHIEF COMPLAINT:  ?UTI.      HISTORY OF PRESENT ILLNESS:  Apparently, Christina Douglas got more agitated and, in terms of her behavior, seemed out of her norm.  She was also running a low-grade fever.  This is a bit of a recurrent theme.  I gave her a course of linezolid recently for MRSA in her urine.  Then two weeks ago, I gave her a course of Diflucan and Cipro, I believe.    On this occasion, she grew Enterococcus gallinarum.  This is sensitive to penicillin, but resistant to vancomycin, quinolones, and aminoglycosides.   The patient is allergic to penicillin.    PHYSICAL EXAMINATION:   GENERAL APPEARANCE:  The patient is not in any distress.   CHEST/RESPIRATORY:  Clear air entry bilaterally.    CARDIOVASCULAR:   CARDIAC:  Heart sounds are normal.  There are no murmurs.    GASTROINTESTINAL:   ABDOMEN:  No tenderness.  No masses.    GENITOURINARY:   BLADDER:  No suprapubic or costovertebral angle tenderness.    ASSESSMENT/PLAN:                       Enteroccus gallinarum in her urine.  I think this is a colonizer and represents asymptomatic bacteruria.  I really do not want to give this patient more antibiotics at this point.  We seem to have a recurrent theme of transient alterations in consciousness and/or with mild fevers where she gets antibiotics.  To be fair, the last go-around two weeks ago may have represented a true infection.   Continued clinical followup only    C

## 2014-09-07 ENCOUNTER — Encounter: Payer: Self-pay | Admitting: Internal Medicine

## 2014-09-07 ENCOUNTER — Non-Acute Institutional Stay (SKILLED_NURSING_FACILITY): Payer: Medicare Other | Admitting: Internal Medicine

## 2014-09-07 DIAGNOSIS — K219 Gastro-esophageal reflux disease without esophagitis: Secondary | ICD-10-CM | POA: Diagnosis not present

## 2014-09-07 DIAGNOSIS — G309 Alzheimer's disease, unspecified: Secondary | ICD-10-CM | POA: Diagnosis not present

## 2014-09-07 DIAGNOSIS — F0280 Dementia in other diseases classified elsewhere without behavioral disturbance: Secondary | ICD-10-CM

## 2014-09-07 DIAGNOSIS — N10 Acute tubulo-interstitial nephritis: Secondary | ICD-10-CM | POA: Diagnosis not present

## 2014-09-07 DIAGNOSIS — E038 Other specified hypothyroidism: Secondary | ICD-10-CM

## 2014-09-07 DIAGNOSIS — M25579 Pain in unspecified ankle and joints of unspecified foot: Secondary | ICD-10-CM | POA: Diagnosis not present

## 2014-09-07 NOTE — Progress Notes (Signed)
Patient ID: Christina Douglas, female   DOB: [birth year], 81 y.o.   MRN: [id number]                 PROGRESS NOT          FACILITY: [facility]       LEVEL OF CARE:   SNF   Routine Visit                     CHIEF COMPLAINT: Medical management of chronic medical conditions including dementia-hypothyroidism-hypertension-diabetes type 2-acute visit secondary to dysuria with UTI      HISTORY OF PRESENT ILLNESS:  Christina Douglas is a patient with severe dementia.     She was admitted to hospital in November, at which time she had a sodium of 158.  She was treated for pneumonia.    When she returned to the facility, she looked very poorly.   I.  However, she actually rallied and has stabilized, gained some weight.    She continues to be stable-apparently she had recently increased confusion and some complaints of dysuria culture has grown out Klebsiella pneumonia this is sensitive to Cipro.  Today she is pleasant I haven't seen her for a while but appears to be her baseline pleasantly confused self.  Vital signs are stable.  She does have a history of type 2 diabetes is only on sliding scale this appears to be stable blood sugars 155 1 3672 most recently.  She has no acute complaints however nursing staff notes when they move her in bed at times she will complain of foot pain and thinks she needs something stronger than Tylenol.  Family medical social history has been reviewed per previous progress notes including 05/20/2014  Do note she has seen GI for apparent dysphasia apparently there's been an alteration in her proton pump inhibitor-per previous review by Dr. Leanord Hawking who does not feel aggressive workup of this is warranted-patient does appear to be stable        CURRENT MEDICATIONS:  Medication list is reviewed and includes.  Spiriva.  Zestril 10 mg daily.  MiraLAX daily.  Symbicort.  Mucinex 600 mg twice a day.  Prilosec 20 mg daily.  Synthroid 25 g  daily.  Humalog sliding scale insulin.  Review of systems this is limited secondary to dementia please see history of present illness.  Currently patient is denying any shortness of breath chest pain swallowing difficulties.  Nursing staff has noted some foot pain at times when she is being maneuvered in bed    PHYSICAL EXAMINATION: Temperature 97.2 pulse 72 respirations 18 blood pressure 130/70-120/70 most recently.     GENERAL APPEARANCE:  The patient is awake and verbal. Her skin is warm and dry.  Oropharynx is clear mucous membranes appear fairly moist.     CHEST/RESPIRATORY:  Clear air entry bilaterally.    CARDIOVASCULAR:   CARDIAC: Regular rate and rhythm without murmur gallop or rub she does not have significant lower extremity edema.    Abdomen is soft nontender positive bowel sounds.  GU I do not appreciate drainage or discharge possibly a small amount of suprapubic tenderness. Muscle skeletal general frailty but moves all extremities 4 I did not note any for Monday of her feet or acute tenderness to palpation although apparently she does complain of foot pain at times when she is being moved.  Psych she is oriented to self is pleasant smiling.  Labs.  01/01/2014.  WBC 9.6 hemoglobin 10.3 platelets 223.  Sodium 141 potassium 4.2 BUN 46 creatinine 1.16.  Assessment and plan.  #1-UTI-will treat with Cipro 250 mg twice a day for 7 days she is apparently having complaints of dysuria along with some confusion at times although she appears to be pleasant today apparently there are episodes of increased agitation.  #2 history of hypertension-this appears to be stable she is on Zestril.  #3 history of hypothyroidism she is on Synthroid we will need to check TSH.  #4 history diabetes type 2 she is on sliding scale blood sugars as noted above appear to be stable.  #5-foot pain-physical exam was benign however staff notes at times especially at night it appears she has  some increased foot pain Will order for tramadol 50 mg Wi.   twice a day when necessary   #6 anemia I suspect chronic disease update CBC.  #7-dysphasia? GERD-she is on Prilosec she has been followed by GI in the past.  History of respiratory issues COPD? She is on Mucinex routinely also on Spiriva and Symbicort this appears to be stable.  Of note also will update a BMP for updated values she does have some history appears of renal insufficiency recent creatinine 1.16 she also has a history of hypertension.  [id number].  .  #  ASSESSMENT/PLAN:                   Severe dementia with a history of weight loss and dehydration.  The patient seems to have largely stabilized.  I will recheck her lab work.    Aspiration associated with severe dementia.  I see no particular need to consider doing barium swallows, endoscopies, etc.    Hypertension.  She is on Lisinopril 10 mg.    History of COPD.  On Symbicort 160/4.5, 2 puffs twice daily.    Hypothyroidism.  On replacement, Synthroid 125 mcg.  I will recheck this, as well.

## 2014-09-23 ENCOUNTER — Non-Acute Institutional Stay (SKILLED_NURSING_FACILITY): Payer: Medicare Other | Admitting: Internal Medicine

## 2014-09-23 DIAGNOSIS — I5032 Chronic diastolic (congestive) heart failure: Secondary | ICD-10-CM

## 2014-09-23 DIAGNOSIS — J069 Acute upper respiratory infection, unspecified: Secondary | ICD-10-CM

## 2014-09-30 NOTE — Progress Notes (Addendum)
Patient ID: Christina Douglas, female   DOB: 01/05/1914, 81 y.o.   MRN: [id number]                PROGRESS NOTE  DATE:  09/23/2014           FACILITY: [facility]                       LEVEL OF CARE:   SNF   Acute Visit             CHIEF COMPLAINT:  Chest congestion, sore throat, and nasal congestion.    HISTORY OF PRESENT ILLNESS:  [name] is a very frail woman with a known history of aspiration pneumonitis.    She was hospitalized in November 2015 with a gross aspiration pneumonia.  She was returned to the facility on essentially comfort care.   We did not expect her to survive.  However, she stabilized and has actually been doing fairly well.    Staff have noted today congestion, a hoarse cough, sore throat.  I have been asked to see her because of this.    PAST MEDICAL HISTORY/PROBLEM LIST:          Chronic diastolic heart failure.    Hypertension.    Chronic renal disease.     Anemia of chronic disease.    Type 2 diabetes.  Not on any current medications.    Chronic renal insufficiency.    Hypothyroidism.  On replacement.     Gastroesophageal reflux disease.    Healthcare-acquired pneumonia.    History of hypernatremia in 2015.     CURRENT MEDICATIONS:  Medication list is reviewed.                Vitamin D3, 2000 U daily.    Multivitamin daily.    Spiriva, two separate inhalations daily.    Zestril 10 q.d.     MiraLAX 17 g daily.    Symbicort 160/4.5, 2 puffs twice daily.    Humibid LA 600 b.i.d.      Os-Cal 500 twice a day.    Prilosec 20 q.d.     Synthroid 125 q.d.      Insulin sliding scale three times a day.     REVIEW OF SYSTEMS:   Not really possible in this patient.     PHYSICAL EXAMINATION:   GENERAL APPEARANCE:  Very frail woman who is awake, conversational.   HEENT:   MOUTH/THROAT:  Oral exam reveals no recent lesions.   CHEST/RESPIRATORY:  There are diffuse crackles widely over both lung fields.  No wheezing is noted.   No accessory muscle use.   CARDIOVASCULAR:   CARDIAC:  Heart sounds are normal.  She appears to be euvolemic.      LYMPHATICS:  None palpable in the cervical, clavicular, or axillary areas.   BREASTS:  No masses are palpable.   GASTROINTESTINAL:   LIVER/SPLEEN/KIDNEYS:  No liver, no spleen.  No tenderness.     CIRCULATION:   EDEMA/VARICOSITIES:  Extremities:  No edema.  No evidence of a DVT.    ASSESSMENT/PLAN:              Cough, congestion.  She has diffuse coarse crackles.  This could represent a viral pneumonitis/atypical pneumonia or perhaps a gross widespread aspiration.  I am going to withhold antibiotics, put her on regular vital signs, and do a chest x-ray on her tomorrow.  History of CHF: I see no evidence of this at the bedside

## 2014-10-30 ENCOUNTER — Non-Acute Institutional Stay (SKILLED_NURSING_FACILITY): Payer: Medicare Other | Admitting: Internal Medicine

## 2014-10-30 DIAGNOSIS — G309 Alzheimer's disease, unspecified: Secondary | ICD-10-CM

## 2014-10-30 DIAGNOSIS — I10 Essential (primary) hypertension: Secondary | ICD-10-CM

## 2014-10-30 DIAGNOSIS — J449 Chronic obstructive pulmonary disease, unspecified: Secondary | ICD-10-CM

## 2014-10-30 DIAGNOSIS — F0280 Dementia in other diseases classified elsewhere without behavioral disturbance: Secondary | ICD-10-CM

## 2014-10-30 DIAGNOSIS — J4489 Other specified chronic obstructive pulmonary disease: Secondary | ICD-10-CM

## 2014-10-30 DIAGNOSIS — E118 Type 2 diabetes mellitus with unspecified complications: Secondary | ICD-10-CM

## 2014-11-04 NOTE — Progress Notes (Addendum)
Patient ID: Christina Douglas, female   DOB: 29-Apr-1913, 81 y.o.   MRN: [id number]                PROGRESS NOTE  DATE:  10/30/2014     FACILITY: [facility]               LEVEL OF CARE:   SNF   Routine Visit            CHIEF COMPLAINT:  Routine visit to follow medical issues.     HISTORY OF PRESENT ILLNESS:  Christina Douglas is a pleasant lady with severe dementia.    In November 2015, she had pneumonia and hypernatremia.  It was not expected that she would survive.  However, she returned to the facility and actually stabilized.     Also in that timeframe, there was concern about aspiration and a lot of pressure generated for a swallowing study in the facility.  I think she saw GI on two occasions.  They put her on a proton pump inhibitor.   I really was not in favor of a swallowing study as I did not feel it would alter the patients prognosis.    She has actually done very well and really continues to be remarkably stable.    CURRENT MEDICATIONS:  Medication list is reviewed.             Vitamin D3, 2000 U daily.     Multivitamin daily.    Spiriva 1 capsule daily.     Zestril 10 q.d.     MiraLAX 17 g q.d.     Synthroid 50 q.d.     Lasix 20 q.d.     Symbicort 160/4.25, 2 puffs b.i.d.      Mucinex 600 daily.      Os-Cal 500, 1 tablet twice daily.     Prilosec 20 b.i.d.      Humalog insulin sliding scale.      LABORATORY DATA:   Lab work from 10/28/2014:     TSH 57.350.  This was checked on 09/09/2014 at which time her TSH was actually slightly suppressed at 0.445.  There was a verbal order from our service to reduce the Synthroid to 50.  However, the patient was on 125 mcg of Synthroid, not 75.  This would probably account for the elevated TSH that we are seeing currently.    REVIEW OF SYSTEMS:   Not reliable from the patient secondary to dementia.      PHYSICAL EXAMINATION:   VITAL SIGNS:     TEMPERATURE:  97.3.    PULSE:  64.    RESPIRATIONS:  16.    BLOOD  PRESSURE:  118/66.     GENERAL APPEARANCE:  The patient is bright and alert.  Feeding herself yogurt.   CHEST/RESPIRATORY:  Exam is clear.       CARDIOVASCULAR:   CARDIAC:  Heart sounds are normal.  She appears to be euvolemic.        GASTROINTESTINAL:   LIVER/SPLEEN/KIDNEYS:  No liver, no spleen.  No tenderness.     GENITOURINARY:   BLADDER:  No bladder distention.    CIRCULATION:   EDEMA/VARICOSITIES:  Extremities:  No edema.    SKIN:   INSPECTION:  No pressure areas over her coccyx.     ASSESSMENT/PLAN:     COPD.  This is not unstable.    Hypothyroidism.  The current elevation in her TSH, I think is explained by the fact that her  Synthroid was reduced from 125 to 50, not from 75 to 50.   I will put her back on 100 mcg a day and repeat her TSH in six weeks.     Severe dementia.  The patient is still quite functional.  This may be a bit of an overstatement.  However, it is still very progressive.    Hypertension.  She is on Lisinopril as well as Lasix.    Diabetes.  She still is on an insulin sliding scale, but is on no other treatment.   Her last hemoglobin A1c on 08/26/2014 was 6.8.  Her BUN is 34, creatinine 1.13.  Estimated GFR is 41 mL/min/1.73 which is indicative of stage III chronic renal failure.  I do not think this lady requires frequent CBG testing.  I am not even completely sure that she requires treatment at this point.  I am going to stop the insulin sliding scale and simply monitor her blood sugars.   Hemoglobin A1c in a few weeks.

## 2014-11-20 ENCOUNTER — Non-Acute Institutional Stay (SKILLED_NURSING_FACILITY): Payer: Medicare Other | Admitting: Internal Medicine

## 2014-11-20 DIAGNOSIS — S91301D Unspecified open wound, right foot, subsequent encounter: Secondary | ICD-10-CM

## 2014-11-25 NOTE — Progress Notes (Signed)
Patient ID: Christina Douglas, female   DOB: Mar 06, 1913, 81 y.o.   MRN: [id number]                PROGRESS NOTE  DATE:  11/20/2014         [facility]               LEVEL OF CARE:   SNF   Acute Visit              CHIEF COMPLAINT:  Review of wounds.      HISTORY OF PRESENT ILLNESS:  [name] is a lady who has had trouble with wounds on her right foot for some time now.   It has actually been a while since I have seen these.  She actually has two--one over her right fifth metatarsal head, plantar aspect, and a small area over the Achilles area of her right heel.  Previously, this had closed over, I believe.  The area has recently developed a surface covering.    REVIEW OF SYSTEMS:   Really not possible.      PHYSICAL EXAMINATION:   VITAL SIGNS:     TEMPERATURE:  97.3.    PULSE:  70.     RESPIRATIONS:  16.    BLOOD PRESSURE:  120/68.   02 SATURATIONS:  97% on room air.   GENERAL APPEARANCE:  The patient looks to be stable.   CHEST/RESPIRATORY:  Clear air entry bilaterally.    CARDIOVASCULAR:   CARDIAC:  Heart sounds are normal.  She does not appear to be dehydrated.      GASTROINTESTINAL:   ABDOMEN:  No masses.    LIVER/SPLEEN/KIDNEYS:  No liver, no spleen.  No tenderness.    GENITOURINARY:   BLADDER:  Not distended.  There is no CVA tenderness.   SKIN:   INSPECTION:  Extremities:  In the right foot over the fifth plantar metatarsal head is a small wound, but with considerable adherent surface slough.  Using a scalpel and forceps, I removed this.  She tolerated this well.  The wound underneath actually looks fairly clean.  Over her right Achilles area, on the heel also, is an unstageable area.  This does not have palpable tenderness.  There is no erythema.    ASSESSMENT/PLAN:            Presumed pressure ulceration on the right fifth plantar aspect.  I have debrided this.  They are using Santyl and foam.  This is appropriate.    Unstageable area on the heel.   They are simply using a border foam cover, which is reasonable.    PROCEDURE NOTE:  Using a #10 blade and pickups, I debrided the fifth metatarsal head.  There was no bleeding.  She tolerated this well.  Removing surface eschar.

## 2014-12-04 ENCOUNTER — Non-Acute Institutional Stay (SKILLED_NURSING_FACILITY): Payer: Medicare Other | Admitting: Internal Medicine

## 2014-12-04 DIAGNOSIS — E039 Hypothyroidism, unspecified: Secondary | ICD-10-CM | POA: Diagnosis not present

## 2014-12-04 DIAGNOSIS — S91301D Unspecified open wound, right foot, subsequent encounter: Secondary | ICD-10-CM | POA: Diagnosis not present

## 2014-12-11 NOTE — Progress Notes (Addendum)
Patient ID: Christina Douglas, female   DOB: 1913-05-15, 81 y.o.   MRN: [id number]                PROGRESS NOTE  DATE:  12/04/2014          [facility]                  LEVEL OF CARE:   SNF   Acute Visit             CHIEF COMPLAINT:  Wound review, hypothyroidism.      HISTORY OF PRESENT ILLNESS:  I have been following Christina Douglas every two weeks for troublesome wounds on her right foot involving the right heel Achilles area, as well as a plantar wound over the right fifth metatarsal head.  I did a mechanical debridement on this two weeks ago.   This is a presumed pressure ulcer.  They have been using Santyl and foam, which is appropriate.    The other issue that has come up is that the patient has not been receiving the correct dose of Synthroid for a month now.   Her TSH is over 70.  She was supposed to be receiving 100 mcg of Synthroid, but has only been receiving 50.    REVIEW OF SYSTEMS:   Really not possible in this patient secondary to dementia.     PHYSICAL EXAMINATION:   VITAL SIGNS:     TEMPERATURE:   PULSE:   RESPIRATIONS:   BLOOD PRESSURE:   02 SATURATIONS:   HEIGHT:   WEIGHT:   CHEST/RESPIRATORY:  Clear air entry bilaterally.    CARDIOVASCULAR:   CARDIAC:  Heart sounds are normal.  She does not appear to be dehydrated.      GASTROINTESTINAL:   ABDOMEN:  No masses.      LIVER/SPLEEN/KIDNEYS:  No liver, no spleen.  No kidneys.    GENITOURINARY:   BLADDER:  Not distended.  There is no CVA tenderness.     SKIN:   INSPECTION:  In the right foot over the fifth plantar metatarsal head, she has a small wound.  I debrided this two weeks ago.  This does not require debridement currently.  I think the Santyl can be changed to a collagen-based dressing.  Over the right Achilles area is an unstageable area.  However, this looks as though this may be covering a healing state.  I have not debrided this and would simply continue with a foam-based dressing for now.     ASSESSMENT/PLAN:        Presumed pressure ulceration on the right plantar metatarsal head.  I have changed this to a prisma-based dressing.    Unstageable area on the heel.   I am not sure how this is going to evolve, but I do not particularly want to debride this for the moment.  Continue with a foam-based dressing.    Hypothyroidism.   I have not had a chance to research all of this.  Her TSH was normal in August of this year.   Apparently, there was an error in transcription at the end of September and the patient is on half the amount of Synthroid she should be.  I am simply going to put her back on 100 mcg and repeat her TSH in six weeks.

## 2014-12-30 ENCOUNTER — Non-Acute Institutional Stay (SKILLED_NURSING_FACILITY): Payer: Medicare Other | Admitting: Internal Medicine

## 2014-12-30 DIAGNOSIS — S91301D Unspecified open wound, right foot, subsequent encounter: Secondary | ICD-10-CM

## 2015-01-03 NOTE — Progress Notes (Addendum)
Patient ID: Christina Douglas, female   DOB: 1913-08-19, 81 y.o.   MRN: [id number]                PROGRESS NOTE  DATE:  12/30/2014         [facility]      LEVEL OF CARE:   SNF   Acute Visit               CHIEF COMPLAINT:  Wound review.      HISTORY OF PRESENT ILLNESS:  I am seeing Christina Douglas today in follow-up for troublesome wounds on her right foot involving the right heel, Achilles area, as well as a plantar wound over the right fifth metatarsal head.    The plantar wound over the right fifth metatarsal head is actually healed.  However, the right Achilles area is still open.  This is macerated.  We had been using foam-based dressings on this area.     PHYSICAL EXAMINATION:   SKIN:   INSPECTION:  Essentially, as I have stated, the area over the right fifth metatarsal head is resolved.  This does not really tell me why this formed in the first place.  The area over the Achilles has a small open area, but looks somewhat moist.  I am going to change this to an alginate-based dressing.      ASSESSMENT/PLAN:            Presumed pressure ulceration over the right fifth plantar metatarsal head.  This is resolved.  Over the Achilles insertion area of her heel, I am going to change to calcium alginate.  Pressure relief is important.

## 2015-01-22 ENCOUNTER — Non-Acute Institutional Stay (SKILLED_NURSING_FACILITY): Payer: Medicare Other | Admitting: Internal Medicine

## 2015-01-22 DIAGNOSIS — E039 Hypothyroidism, unspecified: Secondary | ICD-10-CM

## 2015-01-22 DIAGNOSIS — L97412 Non-pressure chronic ulcer of right heel and midfoot with fat layer exposed: Secondary | ICD-10-CM | POA: Diagnosis not present

## 2015-01-27 ENCOUNTER — Emergency Department (HOSPITAL_COMMUNITY)
Admission: EM | Admit: 2015-01-27 | Discharge: 2015-01-27 | Disposition: A | Discharge status: Home / Self Care | Payer: Medicare Other | Attending: Emergency Medicine | Admitting: Emergency Medicine

## 2015-01-27 ENCOUNTER — Encounter (HOSPITAL_COMMUNITY): Payer: Self-pay | Admitting: *Deleted

## 2015-01-27 DIAGNOSIS — Z79899 Other long term (current) drug therapy: Secondary | ICD-10-CM | POA: Insufficient documentation

## 2015-01-27 DIAGNOSIS — I10 Essential (primary) hypertension: Secondary | ICD-10-CM | POA: Diagnosis not present

## 2015-01-27 DIAGNOSIS — Z862 Personal history of diseases of the blood and blood-forming organs and certain disorders involving the immune mechanism: Secondary | ICD-10-CM | POA: Diagnosis not present

## 2015-01-27 DIAGNOSIS — Z8709 Personal history of other diseases of the respiratory system: Secondary | ICD-10-CM | POA: Insufficient documentation

## 2015-01-27 DIAGNOSIS — N390 Urinary tract infection, site not specified: Secondary | ICD-10-CM | POA: Insufficient documentation

## 2015-01-27 DIAGNOSIS — F0390 Unspecified dementia without behavioral disturbance: Secondary | ICD-10-CM | POA: Insufficient documentation

## 2015-01-27 DIAGNOSIS — Z91040 Latex allergy status: Secondary | ICD-10-CM | POA: Diagnosis not present

## 2015-01-27 DIAGNOSIS — E039 Hypothyroidism, unspecified: Secondary | ICD-10-CM | POA: Diagnosis not present

## 2015-01-27 DIAGNOSIS — Z880 Allergy status to penicillin: Secondary | ICD-10-CM | POA: Diagnosis not present

## 2015-01-27 DIAGNOSIS — E119 Type 2 diabetes mellitus without complications: Secondary | ICD-10-CM | POA: Insufficient documentation

## 2015-01-27 DIAGNOSIS — R109 Unspecified abdominal pain: Secondary | ICD-10-CM | POA: Diagnosis present

## 2015-01-27 DIAGNOSIS — I5030 Unspecified diastolic (congestive) heart failure: Secondary | ICD-10-CM | POA: Diagnosis not present

## 2015-01-27 LAB — URINALYSIS, ROUTINE W REFLEX MICROSCOPIC
Bilirubin Urine: NEGATIVE
Glucose, UA: NEGATIVE mg/dL
Ketones, ur: NEGATIVE mg/dL
Nitrite: NEGATIVE
Protein, ur: 30 mg/dL — AB
Specific Gravity, Urine: 1.015 (ref 1.005–1.030)
pH: 5.5 (ref 5.0–8.0)

## 2015-01-27 LAB — URINE MICROSCOPIC-ADD ON: Squamous Epithelial / LPF: NONE SEEN

## 2015-01-27 MED ORDER — CIPROFLOXACIN HCL 500 MG PO TABS: 500.0000 mg | ORAL_TABLET | Freq: Two times a day (BID) | ORAL | Status: AC

## 2015-01-27 MED ORDER — CIPROFLOXACIN HCL 250 MG PO TABS
500.0000 mg | ORAL_TABLET | Freq: Once | ORAL | Status: AC
  Administered 2015-01-27: 500 mg via ORAL
  Filled 2015-01-27: qty 2

## 2015-01-27 NOTE — ED Notes (Signed)
Report given to Beatrix ShipperJean Weather, L[facility]. Facility is sending transport to pick up patient.

## 2015-01-27 NOTE — ED Provider Notes (Signed)
CSN: [id number]     Arrival date & time 01/27/15  [id number] History   First MD Initiated Contact with Patient 01/27/15 [phone]     Chief Complaint  Patient presents with  . Abdominal Pain     (Consider location/radiation/quality/duration/timing/severity/associated sxs/prior Treatment) HPI   81 year old female sent from [facility] for evaluation of abdominal pain. Apparently she appeared to be having abdominal pain earlier today. On my exam patient has no complaints. She has advanced dementia though and question reliability. No reported fever, vomiting, diarrhea or recent falls.  Past Medical History  Diagnosis Date  . Diabetes mellitus   . Muscle weakness (generalized)   . Diastolic heart failure   . Hypertension   . Thyroid disease   . Kidney injury   . Dementia   . CHF (congestive heart failure) (HCC)   . Anemia   . Hypothyroid   . Interstitial lung disorders (HCC)   . Hyperlipidemia    Past Surgical History  Procedure Laterality Date  . Cataract extraction    . Tonsillectomy    . Total hip arthroplasty     No family history on file. Social History  Substance Use Topics  . Smoking status: Never Smoker   . Smokeless tobacco: Never Used  . Alcohol Use: No   OB History    No data available     Review of Systems  All systems reviewed and negative, other than as noted in HPI.   Allergies  Latex and Penicillins  Home Medications   Prior to Admission medications   Medication Sig Start Date End Date Taking? Authorizing Provider  acetaminophen (TYLENOL) 325 MG tablet Take 650 mg by mouth every 4 (four) hours as needed. Pain/fever   Yes Historical Provider, MD  budesonide-formoterol (SYMBICORT) 160-4.5 MCG/ACT inhaler Inhale 2 puffs into the lungs 2 (two) times daily.   Yes Historical Provider, MD  Calcium-Magnesium-Vitamin D (CALCIUM 500 PO) Take 1 capsule by mouth 2 (two) times daily.   Yes Historical Provider, MD  Cholecalciferol 2000 UNITS TABS Take 1 tablet by  mouth daily.   Yes Historical Provider, MD  furosemide (LASIX) 20 MG tablet Take 20 mg by mouth daily.   Yes Historical Provider, MD  guaiFENesin (MUCINEX) 600 MG 12 hr tablet Take 600 mg by mouth 2 (two) times daily.   Yes Historical Provider, MD  ipratropium-albuterol (DUONEB) 0.5-2.5 (3) MG/3ML SOLN Take 3 mLs by nebulization every 6 (six) hours as needed. Shortness of breath   Yes Historical Provider, MD  levothyroxine (SYNTHROID, LEVOTHROID) 75 MCG tablet Take 125 mcg by mouth daily.    Yes Historical Provider, MD  lisinopril (PRINIVIL,ZESTRIL) 10 MG tablet Take 10 mg by mouth daily. Hold for systolic BP <105   Yes Historical Provider, MD  Multiple Vitamin (MULITIVITAMIN WITH MINERALS) TABS Take 1 tablet by mouth daily.   Yes Historical Provider, MD  Nutritional Supplements (RESOURCE 2.0) LIQD Take 60 mLs by mouth 4 (four) times daily.   Yes Historical Provider, MD  omeprazole (PRILOSEC) 20 MG capsule Take 1 capsule (20 mg total) by mouth 2 (two) times daily before a meal. 04/10/14  Yes Tiffany KocherLeslie S Lewis, PA-C  Oyster Shell (OYSTER CALCIUM) 500 MG TABS tablet Take 500 mg of elemental calcium by mouth 2 (two) times daily.   Yes Historical Provider, MD  polyethylene glycol (MIRALAX / GLYCOLAX) packet Take 17 g by mouth daily.   Yes Historical Provider, MD  tiotropium (SPIRIVA) 18 MCG inhalation capsule Place 18 mcg into inhaler and inhale  daily.   Yes Historical Provider, MD  acidophilus (RISAQUAD) CAPS Take 1 capsule by mouth 2 (two) times daily. 14 day course, should be completed by 05/17/11    Historical Provider, MD  ciprofloxacin (CIPRO) 500 MG tablet Take 1 tablet (500 mg total) by mouth every 12 (twelve) hours. 01/27/15   Raeford Razor, MD  Fluticasone-Salmeterol (ADVAIR) 100-50 MCG/DOSE AEPB Inhale 1 puff into the lungs every 12 (twelve) hours.    Historical Provider, MD  Multiple Vitamins-Minerals (CERTA-VITE PO) Take 1 tablet by mouth.    Historical Provider, MD   BP 128/49 mmHg  Pulse 64   Temp(Src) 98.1 F (36.7 C) (Oral)  Resp 18  SpO2 99% Physical Exam  Constitutional: She appears well-developed and well-nourished. No distress.  HENT:  Head: Normocephalic and atraumatic.  Eyes: Conjunctivae are normal. Right eye exhibits no discharge. Left eye exhibits no discharge.  Neck: Neck supple.  Cardiovascular: Normal rate, regular rhythm and normal heart sounds.  Exam reveals no gallop and no friction rub.   No murmur heard. Pulmonary/Chest: Effort normal and breath sounds normal. No respiratory distress.  Abdominal: Soft. She exhibits no distension. There is no tenderness.  Musculoskeletal: She exhibits no edema or tenderness.  No external signs of acute trauma. Protective boot to feet. Ulcerations to heels w/o superimposed cellulitis.   Neurological: She is alert.  Skin: Skin is warm and dry.  Psychiatric: She has a normal mood and affect. Her behavior is normal. Thought content normal.  Nursing note and vitals reviewed.   ED Course  Procedures (including critical care time) Labs Review Labs Reviewed  URINALYSIS, ROUTINE W REFLEX MICROSCOPIC (NOT AT [facility]) - Abnormal; Notable for the following:    APPearance TURBID (*)    Hgb urine dipstick SMALL (*)    Protein, ur 30 (*)    Leukocytes, UA SMALL (*)    All other components within normal limits  URINE MICROSCOPIC-ADD ON - Abnormal; Notable for the following:    Bacteria, UA MANY (*)    All other components within normal limits  URINE CULTURE    Imaging Review No results found. I have personally reviewed and evaluated these images and lab results as part of my medical decision-making.   EKG Interpretation None      MDM   Final diagnoses:  UTI (lower urinary tract infection)    81 year old female sent for evaluation apparently after she seemed to be having abdominal pain. She denies any pain to me although she does have a history dementia and question the reliability of her history. She does not look  acutely ill. She is afebrile. Hemodynamically stable. Her abdominal exam is benign. Urinalysis was obtained which is consistent with a urinary tract infection. Urine culture sent. Will start her on antibiotics. With how reassuring her workup is a do not feel that further emergent evaluation is indicated. I feel she is stable for discharge at this time.    Raeford Razor, MD 01/27/15 [phone]

## 2015-01-27 NOTE — ED Notes (Signed)
Pt arrived by EMS from [facility]. Called out for abdominal pain. Pt denies. Per EMS pt complaining of knee pain when moved ulcers noted on her heels. Urine has a foul smell.

## 2015-01-27 NOTE — Discharge Instructions (Signed)

## 2015-01-28 NOTE — Progress Notes (Addendum)
Patient ID: Christina Douglas, female   DOB: 03-10-1913, 81 y.o.   MRN: [id number]                PROGRESS NOTE  DATE:  01/22/2015         [facility]               LEVEL OF CARE:   SNF   Acute Visit           CHIEF COMPLAINT:  Wound review, hypothyroidism.     HISTORY OF PRESENT ILLNESS:  I am seeing Christina Douglas today in follow-up of the wounds on her right foot involving the right heel Achilles area as well as an area over the right lateral fifth metatarsal head.  Fortunately, the area over the right fifth metatarsal head has actually healed.  The area over the Achilles area has almost totally epithelialized.  We have been using alginate with a foam cover over this area.    The other issue that I have been following Christina Douglas for is hypothyroidism.  With some research, one of the nurses told me in early November that she had not been receiving her Synthroid correctly for over a month prior to that.  Her TSH at that point was over 70.  She was supposed to be receiving 100 mcg of Synthroid, but had actually only been receiving 50.  We increased her Synthroid back to 100 mcg on 10/30/2014.  I have a TSH on her from 01/15/2015 at 25.7.  This should be on 100 mcg.    REVIEW OF SYSTEMS:   Not possible in this patient secondary to dementia.    PHYSICAL EXAMINATION:   VITAL SIGNS:     TEMPERATURE:  98.8.   PULSE:  74.   RESPIRATIONS:  18.   BLOOD PRESSURE:  132/72.   GENERAL APPEARANCE:  The patient looks well.  Still conversational.    CHEST/RESPIRATORY:  Shallow air entry bilaterally.    CARDIOVASCULAR:   CARDIAC:  Heart sounds are normal.  There are no murmurs.   She appears to be euvolemic.    GASTROINTESTINAL:   ABDOMEN:  No masses.     LIVER/SPLEEN/KIDNEYS:  No liver, no spleen.   GENITOURINARY:   BLADDER:  Not distended.     SKIN:   INSPECTION:  Right foot:  The area over the right fifth metatarsal head actually remains closed.  The area over the right Achilles  on the posterior aspect of her calcaneus has also closed over.  I would continue a foam-based dressing over the heel.  At this point, there is nothing to put calcium alginate on.    ASSESSMENT/PLAN:              Hypothyroidism.  I am going to increase her Synthroid to 125 mcg a day.  Repeat her TSH in six weeks.    Diabetic foot ulcer, right heel.  This has actually healed.    Diabetic retinopathy, macular degeneration.  She is actually not on anything currently for her diabetes.  We are just doing surveillance blood sugars on her.

## 2015-01-30 LAB — URINE CULTURE: Culture: >=100000

## 2015-01-31 ENCOUNTER — Telehealth (HOSPITAL_COMMUNITY): Payer: Self-pay

## 2015-01-31 NOTE — Telephone Encounter (Signed)
Post ED Visit - Positive Culture Follow-up: Successful Patient Follow-Up  Culture assessed and recommendations reviewed by: []  Enzo BiNathan Batchelder, Pharm.D. []  Celedonio MiyamotoJeremy Frens, Pharm.D., BCPS [x]  Garvin FilaMike Maccia, Pharm.D. []  Georgina PillionElizabeth Martin, Pharm.D., BCPS []  Olympia[place]h Pham, Vermont[place]arm.D., BCPS, AAHIVP []  Estella HuskMichelle Turner, Pharm.D., BCPS, AAHIVP []  Tennis Mustassie Stewart, [address] []  Sherle Poeob Vincent, [address]  Positive urine culture, >/= 100,000 colonies ->   []  Patient discharged without antimicrobial prescription and treatment is now indicated [x]  Organism is resistant to prescribed ED discharge antimicrobial (ciprofloxacin) []  Patient with positive blood cultures  Changes discussed with ED provider: Lonna CobbV. Pickering NP New antibiotic prescription Fosfomycin 3 gram every 48h x 2 Called to   Contacted patient, date 01/27/2015, time 10:35  [facility]  Results faxed to facility [phone] Attn Delsa GranaLeah   Kerline Trahan Dorn 01/31/2015, 10:48 AM

## 2015-01-31 NOTE — Progress Notes (Signed)
ED Antimicrobial Stewardship Positive Culture Follow Up   Christina Douglas is an 81 y.o. female who [facility] with a chief complaint of  Chief Complaint  Patient presents with  . Abdominal Pain    Recent Results (from the past 720 hour(s))  Urine culture     Status: None   Collection Time: 01/27/15  8:47 AM  Result Value Ref Range Status   Specimen Description URINE, CATHETERIZED  Final   Special Requests NONE  Final   Culture   Final    >=100,000 COLONIES/mL ESCHERICHIA COLI >=100,000 COLONIES/mL KLEBSIELLA PNEUMONIAE E COLI Confirmed Extended Spectrum Beta-Lactamase Producer (ESBL) [facility]     Report Status 01/30/2015 FINAL  Final   Organism ID, Bacteria ESCHERICHIA COLI  Final   Organism ID, Bacteria KLEBSIELLA PNEUMONIAE  Final      Susceptibility   Escherichia coli - MIC*    AMPICILLIN >=32 RESISTANT Resistant     CEFAZOLIN >=64 RESISTANT Resistant     CEFTRIAXONE >=64 RESISTANT Resistant     CIPROFLOXACIN >=4 RESISTANT Resistant     GENTAMICIN <=1 SENSITIVE Sensitive     IMIPENEM <=0.25 SENSITIVE Sensitive     NITROFURANTOIN <=16 SENSITIVE Sensitive     TRIMETH/SULFA >=320 RESISTANT Resistant     AMPICILLIN/SULBACTAM >=32 RESISTANT Resistant     PIP/TAZO 64 INTERMEDIATE Intermediate     * >=100,000 COLONIES/mL ESCHERICHIA COLI   Klebsiella pneumoniae - MIC*    AMPICILLIN 16 RESISTANT Resistant     CEFAZOLIN <=4 SENSITIVE Sensitive     CEFTRIAXONE <=1 SENSITIVE Sensitive     CIPROFLOXACIN <=0.25 SENSITIVE Sensitive     GENTAMICIN <=1 SENSITIVE Sensitive     IMIPENEM <=0.25 SENSITIVE Sensitive     NITROFURANTOIN 128 RESISTANT Resistant     TRIMETH/SULFA <=20 SENSITIVE Sensitive     AMPICILLIN/SULBACTAM 8 SENSITIVE Sensitive     PIP/TAZO 8 SENSITIVE Sensitive     * >=100,000 COLONIES/mL KLEBSIELLA PNEUMONIAE    [x]  Treated with ciprofloxacin, organism resistant to prescribed antimicrobial []  Patient discharged  originally without antimicrobial agent and treatment is now indicated  New antibiotic prescription: D/c ciprofloxacin. Start fosfomycin 3 g q48 h x 2 doses  ED Provider: Glean HessElizabeth Westfall, PA-C  Christina MillardMichael A Tesia Douglas 01/31/2015, 8:26 AM Infectious Diseases Pharmacist Phone# [phone]

## 2015-03-05 DEATH — deceased

## 2016-10-27 IMAGING — CR DG ABDOMEN ACUTE W/ 1V CHEST
2 series · 3 of 3 positions shown · non-contrast
Comparison: 12/14/2013 09/04/2011 chest x-ray.

CLINICAL DATA: [AGE] diabetic hypertensive female with
generalized abdominal pain. Subsequent encounter.

EXAM:
ACUTE ABDOMEN SERIES (ABDOMEN 2 VIEW & CHEST 1 VIEW)

[ap portable (1 of 2)]
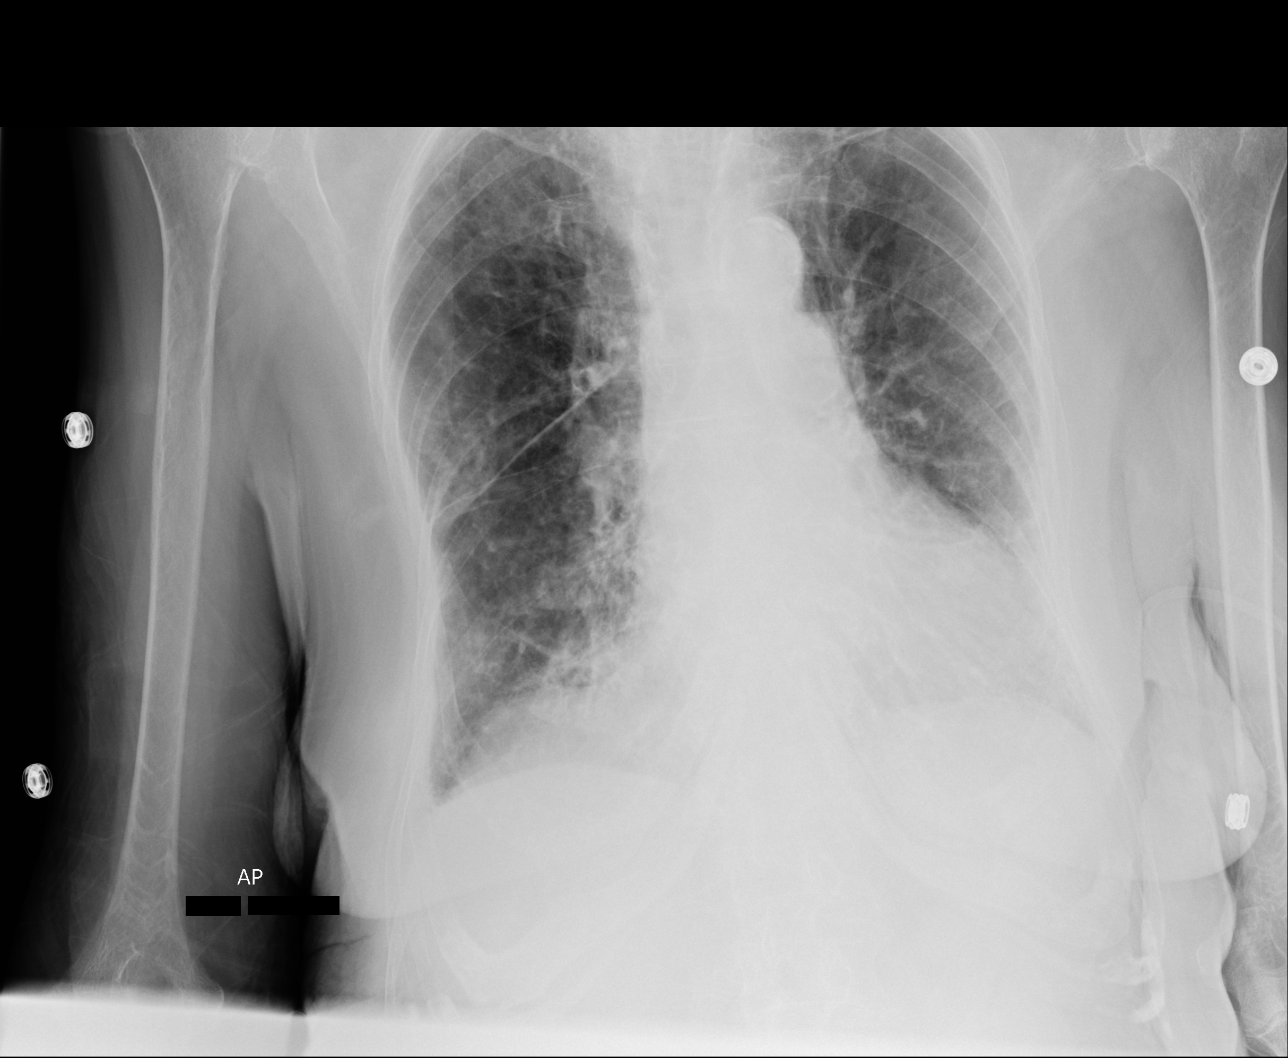

[Series 2: ap portable · 0.17mm/px · 2 of 2 slices shown (2 of 2)]
[im 1/2]
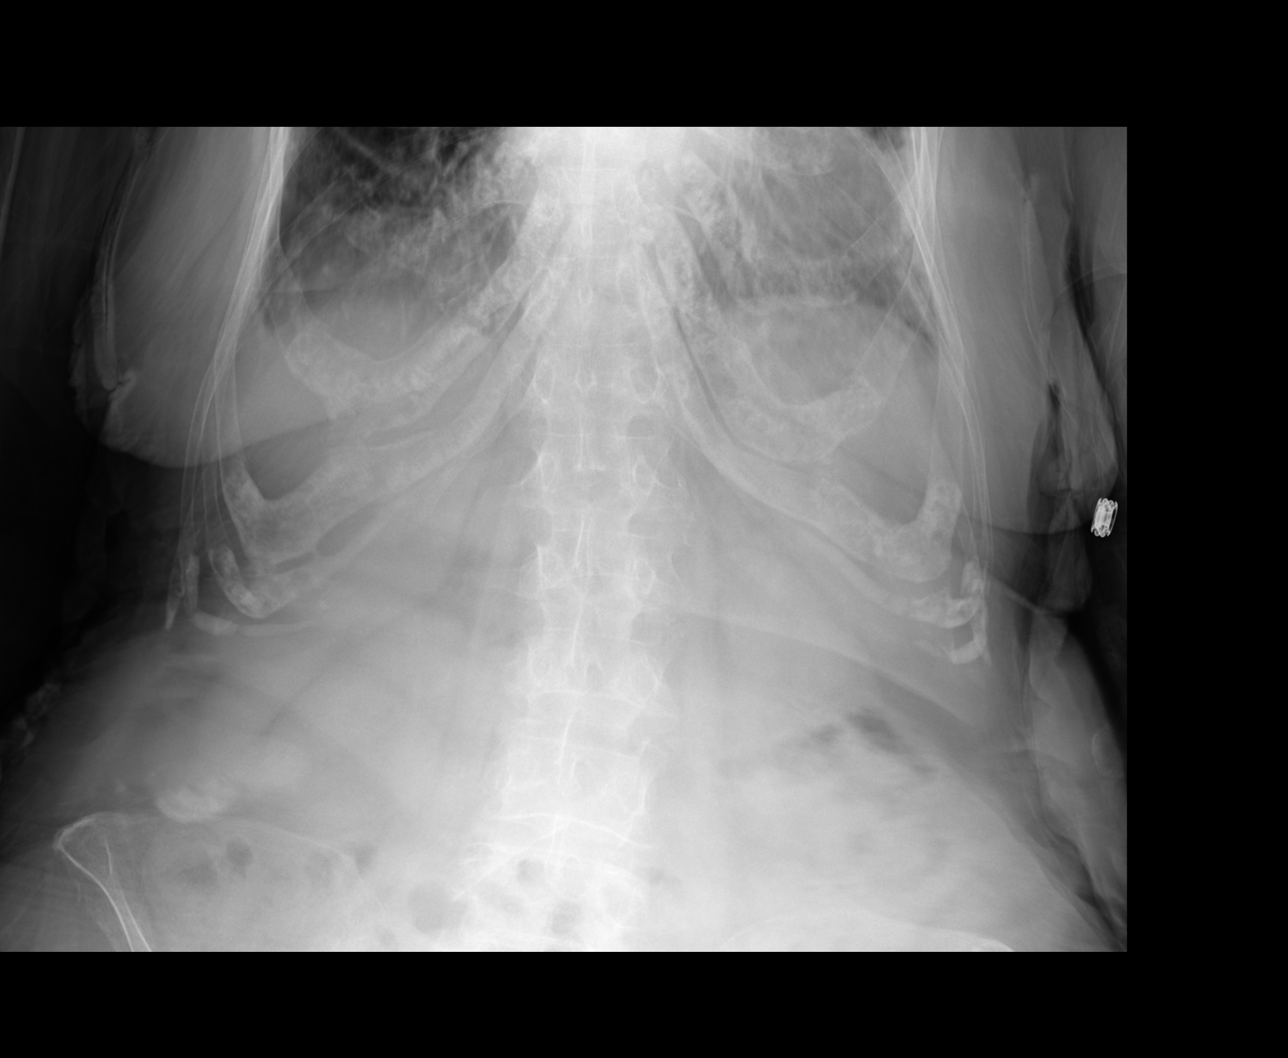
[im 2/2]
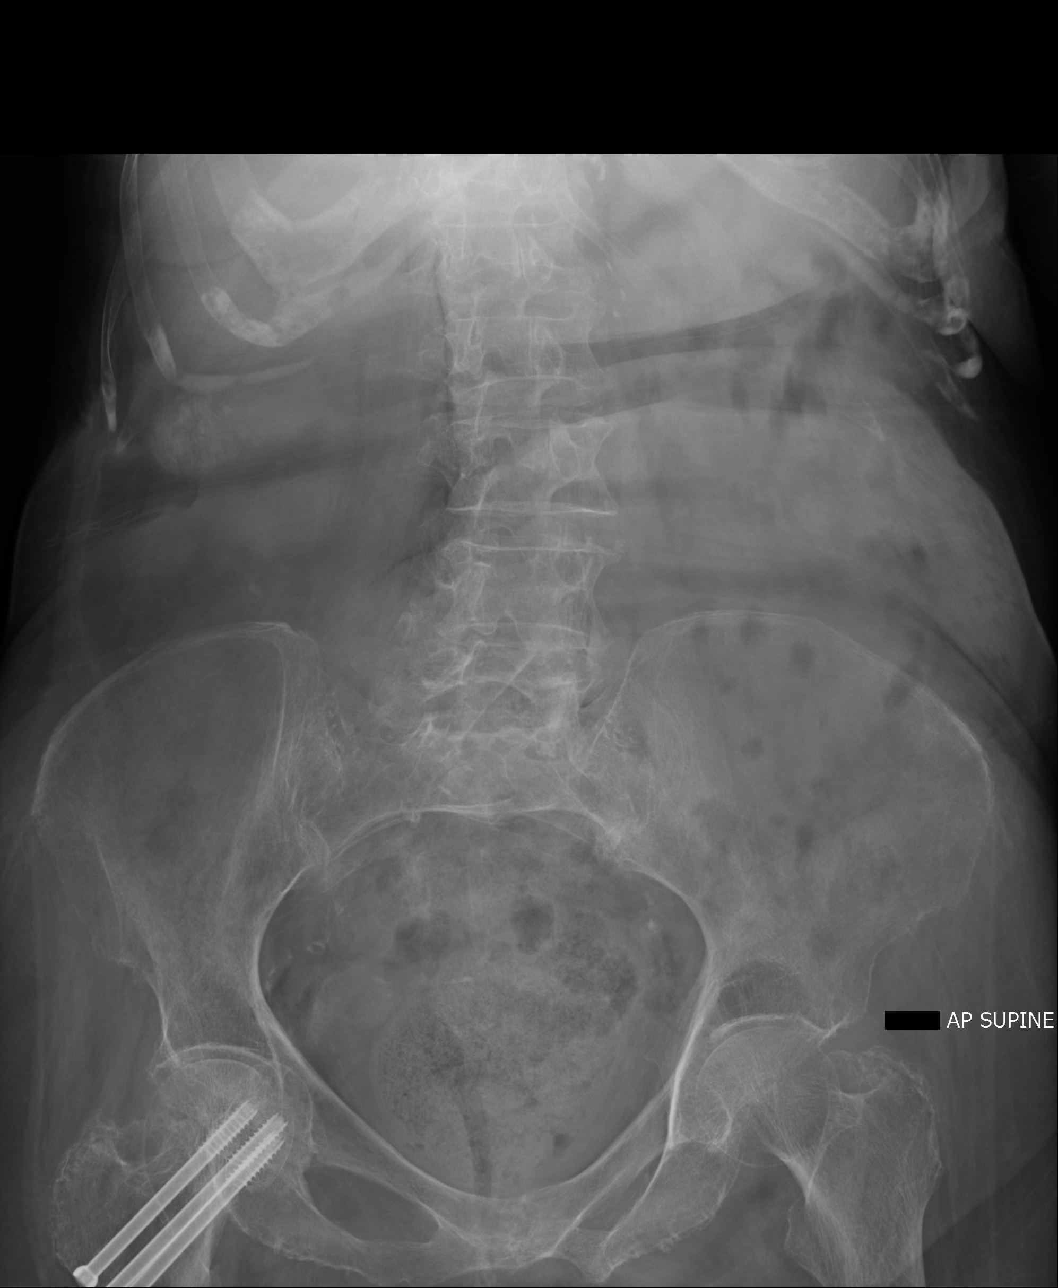

[3 of 3 positions shown; findings below may reference images not displayed]

FINDINGS: Cardiomegaly. Pulmonary vascular prominence superimposed upon
chronic lung changes. No segmental consolidation. Crowding of lung
markings felt to be responsible for opacity along peripheral aspect
right mid lung.

Calcified mildly tortuous aorta.

Nonspecific bowel gas pattern without plain film evidence of bowel
obstruction or free intraperitoneal air.

Gallstones suspected.

Prior right hip surgery.

Scoliosis lumbar spine.
IMPRESSION: Nonspecific bowel gas pattern without plain film evidence of bowel
obstruction or free intraperitoneal air.

Gallstones suspected.

Pulmonary vascular prominence superimposed upon chronic lung
changes.

Cardiomegaly.

Calcified slightly tortuous aorta.
